# Patient Record
Sex: Male | Born: 1970 | Race: White | Hispanic: No | Marital: Married | Smoking: Current every day smoker
Health system: Southern US, Community
[De-identification: ages and names within clinical notes are randomized; demographics above are authoritative.]

## PROBLEM LIST (undated history)

## (undated) DIAGNOSIS — G629 Polyneuropathy, unspecified: Secondary | ICD-10-CM

## (undated) DIAGNOSIS — I2510 Atherosclerotic heart disease of native coronary artery without angina pectoris: Secondary | ICD-10-CM

## (undated) DIAGNOSIS — I219 Acute myocardial infarction, unspecified: Secondary | ICD-10-CM

## (undated) DIAGNOSIS — E119 Type 2 diabetes mellitus without complications: Secondary | ICD-10-CM

## (undated) DIAGNOSIS — J40 Bronchitis, not specified as acute or chronic: Secondary | ICD-10-CM

## (undated) DIAGNOSIS — J449 Chronic obstructive pulmonary disease, unspecified: Secondary | ICD-10-CM

## (undated) DIAGNOSIS — K219 Gastro-esophageal reflux disease without esophagitis: Secondary | ICD-10-CM

## (undated) DIAGNOSIS — F419 Anxiety disorder, unspecified: Secondary | ICD-10-CM

## (undated) DIAGNOSIS — Z720 Tobacco use: Secondary | ICD-10-CM

## (undated) DIAGNOSIS — D691 Qualitative platelet defects: Secondary | ICD-10-CM

## (undated) HISTORY — PX: CORONARY ANGIOPLASTY WITH STENT PLACEMENT: SHX49

## (undated) HISTORY — PX: PACEMAKER INSERTION: SHX728

## (undated) HISTORY — PX: CARDIAC DEFIBRILLATOR PLACEMENT: SHX171

---

## 2003-03-17 ENCOUNTER — Encounter: Admission: RE | Admit: 2003-03-17 | Discharge: 2003-06-15 | Payer: Self-pay | Admitting: Family Medicine

## 2014-08-20 ENCOUNTER — Encounter (HOSPITAL_BASED_OUTPATIENT_CLINIC_OR_DEPARTMENT_OTHER): Payer: Self-pay | Admitting: *Deleted

## 2014-08-20 ENCOUNTER — Emergency Department (HOSPITAL_BASED_OUTPATIENT_CLINIC_OR_DEPARTMENT_OTHER)
Admission: EM | Admit: 2014-08-20 | Discharge: 2014-08-21 | Disposition: A | Discharge status: Home / Self Care | Payer: Medicare HMO | Attending: Emergency Medicine | Admitting: Emergency Medicine

## 2014-08-20 DIAGNOSIS — R51 Headache: Secondary | ICD-10-CM | POA: Diagnosis present

## 2014-08-20 DIAGNOSIS — Z880 Allergy status to penicillin: Secondary | ICD-10-CM | POA: Diagnosis not present

## 2014-08-20 DIAGNOSIS — I2510 Atherosclerotic heart disease of native coronary artery without angina pectoris: Secondary | ICD-10-CM | POA: Insufficient documentation

## 2014-08-20 DIAGNOSIS — Z9861 Coronary angioplasty status: Secondary | ICD-10-CM | POA: Diagnosis not present

## 2014-08-20 DIAGNOSIS — Z720 Tobacco use: Secondary | ICD-10-CM | POA: Diagnosis not present

## 2014-08-20 DIAGNOSIS — Z950 Presence of cardiac pacemaker: Secondary | ICD-10-CM | POA: Insufficient documentation

## 2014-08-20 DIAGNOSIS — J449 Chronic obstructive pulmonary disease, unspecified: Secondary | ICD-10-CM | POA: Diagnosis not present

## 2014-08-20 DIAGNOSIS — I252 Old myocardial infarction: Secondary | ICD-10-CM | POA: Insufficient documentation

## 2014-08-20 DIAGNOSIS — E119 Type 2 diabetes mellitus without complications: Secondary | ICD-10-CM | POA: Diagnosis not present

## 2014-08-20 DIAGNOSIS — R519 Headache, unspecified: Secondary | ICD-10-CM

## 2014-08-20 HISTORY — DX: Chronic obstructive pulmonary disease, unspecified: J44.9

## 2014-08-20 HISTORY — DX: Bronchitis, not specified as acute or chronic: J40

## 2014-08-20 HISTORY — DX: Acute myocardial infarction, unspecified: I21.9

## 2014-08-20 HISTORY — DX: Atherosclerotic heart disease of native coronary artery without angina pectoris: I25.10

## 2014-08-20 HISTORY — DX: Type 2 diabetes mellitus without complications: E11.9

## 2014-08-20 HISTORY — DX: Polyneuropathy, unspecified: G62.9

## 2014-08-20 NOTE — ED Notes (Signed)
Pt c/o HA that began 4 days ago and is getting worse.  Pt also c/o fingers on left hand and left side of face tingling.

## 2014-08-21 ENCOUNTER — Emergency Department (HOSPITAL_BASED_OUTPATIENT_CLINIC_OR_DEPARTMENT_OTHER): Payer: Medicare HMO

## 2014-08-21 DIAGNOSIS — R51 Headache: Secondary | ICD-10-CM | POA: Diagnosis not present

## 2014-08-21 LAB — BASIC METABOLIC PANEL
ANION GAP: 11 (ref 5–15)
BUN: 17 mg/dL (ref 6–20)
CALCIUM: 9.3 mg/dL (ref 8.9–10.3)
CO2: 24 mmol/L (ref 22–32)
CREATININE: 1.1 mg/dL (ref 0.61–1.24)
Chloride: 104 mmol/L (ref 101–111)
GFR calc non Af Amer: >60 mL/min (ref >60)
GLUCOSE: 76 mg/dL (ref 65–99)
POTASSIUM: 3.3 mmol/L — AB (ref 3.5–5.1)
Sodium: 139 mmol/L (ref 135–145)

## 2014-08-21 LAB — CBC WITH DIFFERENTIAL/PLATELET
BASOS ABS: 0 10*3/uL (ref 0.0–0.1)
Basophils Relative: 0 % (ref 0–1)
EOS ABS: 0.4 10*3/uL (ref 0.0–0.7)
EOS PCT: 3 % (ref 0–5)
HCT: 50.4 % (ref 39.0–52.0)
Hemoglobin: 17.6 g/dL — ABNORMAL HIGH (ref 13.0–17.0)
LYMPHS ABS: 4.4 10*3/uL — AB (ref 0.7–4.0)
Lymphocytes Relative: 30 % (ref 12–46)
MCH: 33.7 pg (ref 26.0–34.0)
MCHC: 34.9 g/dL (ref 30.0–36.0)
MCV: 96.4 fL (ref 78.0–100.0)
MONO ABS: 1.2 10*3/uL — AB (ref 0.1–1.0)
Monocytes Relative: 8 % (ref 3–12)
Neutro Abs: 8.8 10*3/uL — ABNORMAL HIGH (ref 1.7–7.7)
Neutrophils Relative %: 59 % (ref 43–77)
Platelets: 122 10*3/uL — ABNORMAL LOW (ref 150–400)
RBC: 5.23 MIL/uL (ref 4.22–5.81)
RDW: 13.3 % (ref 11.5–15.5)
WBC: 14.8 10*3/uL — AB (ref 4.0–10.5)

## 2014-08-21 MED ORDER — METOCLOPRAMIDE HCL 5 MG/ML IJ SOLN
10.0000 mg | Freq: Once | INTRAMUSCULAR | Status: AC
  Administered 2014-08-21: 10 mg via INTRAVENOUS
  Filled 2014-08-21: qty 2

## 2014-08-21 MED ORDER — KETOROLAC TROMETHAMINE 15 MG/ML IJ SOLN
15.0000 mg | Freq: Once | INTRAMUSCULAR | Status: AC
  Administered 2014-08-21: 15 mg via INTRAVENOUS
  Filled 2014-08-21: qty 1

## 2014-08-21 MED ORDER — DIPHENHYDRAMINE HCL 50 MG/ML IJ SOLN
25.0000 mg | Freq: Once | INTRAMUSCULAR | Status: AC
  Administered 2014-08-21: 25 mg via INTRAVENOUS
  Filled 2014-08-21: qty 1

## 2014-08-21 MED ORDER — DEXAMETHASONE SODIUM PHOSPHATE 10 MG/ML IJ SOLN
10.0000 mg | Freq: Once | INTRAMUSCULAR | Status: AC
  Administered 2014-08-21: 10 mg via INTRAVENOUS
  Filled 2014-08-21: qty 1

## 2014-08-21 NOTE — Discharge Instructions (Signed)
Sinus Headache °A sinus headache is when your sinuses become clogged or swollen. Sinus headaches can range from mild to severe.  °CAUSES °A sinus headache can have different causes, such as: °· Colds. °· Sinus infections. °· Allergies. °SYMPTOMS  °Symptoms of a sinus headache may vary and can include: °· Headache. °· Pain or pressure in the face. °· Congested or runny nose. °· Fever. °· Inability to smell. °· Pain in upper teeth. °Weather changes can make symptoms worse. °TREATMENT  °The treatment of a sinus headache depends on the cause. °· Sinus pain caused by a sinus infection may be treated with antibiotic medicine. °· Sinus pain caused by allergies may be helped by allergy medicines (antihistamines) and medicated nasal sprays. °· Sinus pain caused by congestion may be helped by flushing the nose and sinuses with saline solution. °HOME CARE INSTRUCTIONS  °· If antibiotics are prescribed, take them as directed. Finish them even if you start to feel better. °· Only take over-the-counter or prescription medicines for pain, discomfort, or fever as directed by your caregiver. °· If you have congestion, use a nasal spray to help reduce pressure. °SEEK IMMEDIATE MEDICAL CARE IF: °· You have a fever. °· You have headaches more than once a week. °· You have sensitivity to light or sound. °· You have repeated nausea and vomiting. °· You have vision problems. °· You have sudden, severe pain in your face or head. °· You have a seizure. °· You are confused. °· Your sinus headaches do not get better after treatment. Many people think they have a sinus headache when they actually have migraines or tension headaches. °MAKE SURE YOU:  °· Understand these instructions. °· Will watch your condition. °· Will get help right away if you are not doing well or get worse. °Document Released: 04/21/2004 Document Revised: 06/06/2011 Document Reviewed: 06/12/2010 °[employer]® Patient Information ©2015 [employer]. This information is not  intended to replace advice given to you by your health care provider. Make sure you discuss any questions you have with your health care provider. ° °

## 2014-08-21 NOTE — ED Provider Notes (Signed)
CSN: [id number]     Arrival date & time 08/20/14  2308 History   First MD Initiated Contact with Patient 08/20/14 2352     Chief Complaint  Patient presents with  . Headache     (Consider location/radiation/quality/duration/timing/severity/associated sxs/prior Treatment) Patient is a 44 y.o. male presenting with headaches.  Headache Pain location:  Generalized Quality:  Dull Radiates to:  Does not radiate Onset quality:  Gradual Duration:  3 days Timing:  Constant Progression:  Worsening Relieved by:  Nothing Worsened by:  Activity and light Associated symptoms: no abdominal pain, no fever, no nausea, no near-syncope, no numbness, no vomiting and no weakness     Past Medical History  Diagnosis Date  . Coronary artery disease   . Myocardial infarct   . Diabetes mellitus without complication   . COPD (chronic obstructive pulmonary disease)   . Bronchitis   . Neuropathy    Past Surgical History  Procedure Laterality Date  . Cardiac defibrillator placement    . Coronary angioplasty with stent placement    . Cardiac defibrillator placement    . Pacemaker insertion     No family history on file. History  Substance Use Topics  . Smoking status: Current Every Day Smoker  . Smokeless tobacco: Not on file  . Alcohol Use: No    Review of Systems  Constitutional: Negative for fever.  Cardiovascular: Negative for near-syncope.  Gastrointestinal: Negative for nausea, vomiting and abdominal pain.  Neurological: Positive for headaches. Negative for weakness and numbness.  All other systems reviewed and are negative.     Allergies  Lipitor; Penicillins; Trichlorethylene; and Wellbutrin  Home Medications   Prior to Admission medications   Not on File   BP 115/57 mmHg  Pulse 68  Temp(Src) 98 F (36.7 C) (Oral)  Resp 18  Ht 5\' 10"  (1.778 m)  Wt 385 lb (174.635 kg)  BMI 55.24 kg/m2  SpO2 99% Physical Exam  Constitutional: He is oriented to person, place, and  time. He appears well-developed and well-nourished.  HENT:  Head: Normocephalic and atraumatic.  Eyes: Conjunctivae and EOM are normal.  Neck: Normal range of motion. Neck supple. No Brudzinski's sign and no Kernig's sign noted.  Cardiovascular: Normal rate, regular rhythm and normal heart sounds.   Pulmonary/Chest: Effort normal and breath sounds normal. No respiratory distress.  Abdominal: He exhibits no distension. There is no tenderness. There is no rebound and no guarding.  Musculoskeletal: Normal range of motion.  Neurological: He is alert and oriented to person, place, and time. He has normal strength and normal reflexes. No cranial nerve deficit or sensory deficit. Gait normal. GCS eye subscore is 4. GCS verbal subscore is 5. GCS motor subscore is 6.  Skin: Skin is warm and dry.  Vitals reviewed.   ED Course  Procedures (including critical care time) Labs Review Labs Reviewed  CBC WITH DIFFERENTIAL/PLATELET - Abnormal; Notable for the following:    WBC 14.8 (*)    Hemoglobin 17.6 (*)    Platelets 122 (*)    Neutro Abs 8.8 (*)    Lymphs Abs 4.4 (*)    Monocytes Absolute 1.2 (*)    All other components within normal limits  BASIC METABOLIC PANEL - Abnormal; Notable for the following:    Potassium 3.3 (*)    All other components within normal limits    Imaging Review Ct Head Wo Contrast  08/21/2014   CLINICAL DATA:  Headache.  Left hand and face tingling.  EXAM: CT HEAD  WITHOUT CONTRAST  TECHNIQUE: Contiguous axial images were obtained from the base of the skull through the vertex without intravenous contrast.  COMPARISON:  05/29/2014  FINDINGS: Gray-white differentiation is maintained. No CT evidence of acute large territory infarct. No intraparenchymal or extra-axial mass or hemorrhage. Normal size and configuration of the ventricles and basilar cisterns. No midline shift. Intracranial atherosclerosis. There is near complete opacification of the left maxillary sinus. There is  scattered opacification of the right maxillary sinus. The remaining paranasal sinuses and mastoid air cells are normally aerated. No air-fluid levels. Regional soft tissues appear normal. No displaced calvarial fracture.  IMPRESSION: 1. Negative noncontrast head CT. 2. Sinus disease as above.  No air-fluid levels.   Electronically Signed   By: Simonne Come M.D.   On: 08/21/2014 02:38     EKG Interpretation None      MDM   Final diagnoses:  Sinus headache    44 y.o. male with pertinent PMH of CAD, COPD, DM presents with ha in setting of recent URI.  No concerning historical elements, but pt states he has never had a ha in the past of any significance.  Exam today reassuring, no meningitic signs, and no ataxia or other signs of CVA.  HA relieved by reglan, benadryl, decadron, and toradol.  CT scan demonstrated sinus disease, and pt has frontal sinus tenderness.  This is the likely etiology of his symptoms.  DC home to fu with PCP, standard return precautions given.    I have reviewed all laboratory and imaging studies if ordered as above  1. Sinus headache         Mirian Mo, MD 08/21/14 [phone]

## 2014-08-21 NOTE — ED Notes (Signed)
Pt returned from CT °

## 2014-08-21 NOTE — ED Notes (Signed)
Patient transported to CT 

## 2016-01-03 ENCOUNTER — Encounter (HOSPITAL_COMMUNITY): Payer: Self-pay

## 2016-01-03 DIAGNOSIS — Z888 Allergy status to other drugs, medicaments and biological substances status: Secondary | ICD-10-CM | POA: Diagnosis not present

## 2016-01-03 DIAGNOSIS — I2510 Atherosclerotic heart disease of native coronary artery without angina pectoris: Secondary | ICD-10-CM | POA: Diagnosis not present

## 2016-01-03 DIAGNOSIS — I493 Ventricular premature depolarization: Secondary | ICD-10-CM | POA: Diagnosis not present

## 2016-01-03 DIAGNOSIS — E119 Type 2 diabetes mellitus without complications: Secondary | ICD-10-CM | POA: Diagnosis present

## 2016-01-03 DIAGNOSIS — Z955 Presence of coronary angioplasty implant and graft: Secondary | ICD-10-CM | POA: Diagnosis not present

## 2016-01-03 DIAGNOSIS — Z79899 Other long term (current) drug therapy: Secondary | ICD-10-CM | POA: Insufficient documentation

## 2016-01-03 DIAGNOSIS — I110 Hypertensive heart disease with heart failure: Secondary | ICD-10-CM | POA: Diagnosis not present

## 2016-01-03 DIAGNOSIS — Z794 Long term (current) use of insulin: Secondary | ICD-10-CM | POA: Insufficient documentation

## 2016-01-03 DIAGNOSIS — I219 Acute myocardial infarction, unspecified: Secondary | ICD-10-CM | POA: Insufficient documentation

## 2016-01-03 DIAGNOSIS — Z880 Allergy status to penicillin: Secondary | ICD-10-CM | POA: Diagnosis not present

## 2016-01-03 DIAGNOSIS — D691 Qualitative platelet defects: Secondary | ICD-10-CM | POA: Diagnosis present

## 2016-01-03 DIAGNOSIS — F17210 Nicotine dependence, cigarettes, uncomplicated: Secondary | ICD-10-CM | POA: Insufficient documentation

## 2016-01-03 DIAGNOSIS — Z950 Presence of cardiac pacemaker: Secondary | ICD-10-CM | POA: Insufficient documentation

## 2016-01-03 DIAGNOSIS — I252 Old myocardial infarction: Secondary | ICD-10-CM | POA: Insufficient documentation

## 2016-01-03 DIAGNOSIS — Z95810 Presence of automatic (implantable) cardiac defibrillator: Secondary | ICD-10-CM | POA: Insufficient documentation

## 2016-01-03 DIAGNOSIS — D696 Thrombocytopenia, unspecified: Secondary | ICD-10-CM | POA: Diagnosis not present

## 2016-01-03 DIAGNOSIS — E785 Hyperlipidemia, unspecified: Secondary | ICD-10-CM | POA: Insufficient documentation

## 2016-01-03 DIAGNOSIS — J8410 Pulmonary fibrosis, unspecified: Secondary | ICD-10-CM | POA: Insufficient documentation

## 2016-01-03 DIAGNOSIS — R079 Chest pain, unspecified: Principal | ICD-10-CM | POA: Insufficient documentation

## 2016-01-03 DIAGNOSIS — Z6841 Body Mass Index (BMI) 40.0 and over, adult: Secondary | ICD-10-CM | POA: Insufficient documentation

## 2016-01-03 DIAGNOSIS — J449 Chronic obstructive pulmonary disease, unspecified: Secondary | ICD-10-CM | POA: Diagnosis not present

## 2016-01-03 DIAGNOSIS — I517 Cardiomegaly: Secondary | ICD-10-CM | POA: Insufficient documentation

## 2016-01-03 DIAGNOSIS — E1140 Type 2 diabetes mellitus with diabetic neuropathy, unspecified: Secondary | ICD-10-CM | POA: Diagnosis not present

## 2016-01-03 DIAGNOSIS — F419 Anxiety disorder, unspecified: Secondary | ICD-10-CM | POA: Diagnosis present

## 2016-01-03 DIAGNOSIS — I5020 Unspecified systolic (congestive) heart failure: Secondary | ICD-10-CM | POA: Insufficient documentation

## 2016-01-03 DIAGNOSIS — K219 Gastro-esophageal reflux disease without esophagitis: Secondary | ICD-10-CM | POA: Insufficient documentation

## 2016-01-03 DIAGNOSIS — Z7982 Long term (current) use of aspirin: Secondary | ICD-10-CM | POA: Insufficient documentation

## 2016-01-03 NOTE — ED Triage Notes (Signed)
Pt states that central CP started night, radiation to L arm. C/o SOB, denies n/v. Pt states about a month ago had cath with a blockage found and they were unable to remove the blockage. PTA pt took three nitros.

## 2016-01-04 ENCOUNTER — Observation Stay (HOSPITAL_COMMUNITY)
Admission: EM | Admit: 2016-01-04 | Discharge: 2016-01-04 | Disposition: A | Discharge status: Home / Self Care | Payer: Medicare Other | Attending: Internal Medicine | Admitting: Internal Medicine

## 2016-01-04 ENCOUNTER — Emergency Department (HOSPITAL_COMMUNITY): Payer: Medicare Other

## 2016-01-04 ENCOUNTER — Encounter (HOSPITAL_COMMUNITY): Payer: Self-pay | Admitting: Internal Medicine

## 2016-01-04 ENCOUNTER — Observation Stay (HOSPITAL_BASED_OUTPATIENT_CLINIC_OR_DEPARTMENT_OTHER): Payer: Medicare Other

## 2016-01-04 DIAGNOSIS — E785 Hyperlipidemia, unspecified: Secondary | ICD-10-CM

## 2016-01-04 DIAGNOSIS — I5022 Chronic systolic (congestive) heart failure: Secondary | ICD-10-CM | POA: Diagnosis present

## 2016-01-04 DIAGNOSIS — I208 Other forms of angina pectoris: Secondary | ICD-10-CM

## 2016-01-04 DIAGNOSIS — M79669 Pain in unspecified lower leg: Secondary | ICD-10-CM | POA: Diagnosis present

## 2016-01-04 DIAGNOSIS — I2510 Atherosclerotic heart disease of native coronary artery without angina pectoris: Secondary | ICD-10-CM | POA: Diagnosis present

## 2016-01-04 DIAGNOSIS — R079 Chest pain, unspecified: Secondary | ICD-10-CM | POA: Diagnosis present

## 2016-01-04 DIAGNOSIS — J449 Chronic obstructive pulmonary disease, unspecified: Secondary | ICD-10-CM | POA: Diagnosis present

## 2016-01-04 DIAGNOSIS — M79661 Pain in right lower leg: Secondary | ICD-10-CM

## 2016-01-04 DIAGNOSIS — Z720 Tobacco use: Secondary | ICD-10-CM | POA: Diagnosis present

## 2016-01-04 DIAGNOSIS — K219 Gastro-esophageal reflux disease without esophagitis: Secondary | ICD-10-CM | POA: Diagnosis present

## 2016-01-04 DIAGNOSIS — E119 Type 2 diabetes mellitus without complications: Secondary | ICD-10-CM | POA: Diagnosis not present

## 2016-01-04 DIAGNOSIS — M79662 Pain in left lower leg: Secondary | ICD-10-CM

## 2016-01-04 DIAGNOSIS — F419 Anxiety disorder, unspecified: Secondary | ICD-10-CM | POA: Diagnosis not present

## 2016-01-04 DIAGNOSIS — I2089 Other forms of angina pectoris: Secondary | ICD-10-CM

## 2016-01-04 DIAGNOSIS — D691 Qualitative platelet defects: Secondary | ICD-10-CM

## 2016-01-04 HISTORY — DX: Tobacco use: Z72.0

## 2016-01-04 HISTORY — DX: Qualitative platelet defects: D69.1

## 2016-01-04 HISTORY — DX: Anxiety disorder, unspecified: F41.9

## 2016-01-04 HISTORY — DX: Gastro-esophageal reflux disease without esophagitis: K21.9

## 2016-01-04 LAB — BASIC METABOLIC PANEL
Anion gap: 8 (ref 5–15)
BUN: 13 mg/dL (ref 6–20)
CALCIUM: 9.2 mg/dL (ref 8.9–10.3)
CO2: 25 mmol/L (ref 22–32)
CREATININE: 0.96 mg/dL (ref 0.61–1.24)
Chloride: 102 mmol/L (ref 101–111)
GFR calc Af Amer: >60 mL/min (ref >60)
Glucose, Bld: 357 mg/dL — ABNORMAL HIGH (ref 65–99)
Potassium: 4.3 mmol/L (ref 3.5–5.1)
Sodium: 135 mmol/L (ref 135–145)

## 2016-01-04 LAB — LIPID PANEL
Cholesterol: 151 mg/dL (ref 0–200)
HDL: 28 mg/dL — AB (ref >40)
LDL CALC: 63 mg/dL (ref 0–99)
TRIGLYCERIDES: 299 mg/dL — AB (ref <150)
Total CHOL/HDL Ratio: 5.4 RATIO
VLDL: 60 mg/dL — ABNORMAL HIGH (ref 0–40)

## 2016-01-04 LAB — CBC
HCT: 44.4 % (ref 39.0–52.0)
HCT: 43.1 % (ref 39.0–52.0)
HEMOGLOBIN: 14.7 g/dL (ref 13.0–17.0)
Hemoglobin: 14.3 g/dL (ref 13.0–17.0)
MCH: 33.6 pg (ref 26.0–34.0)
MCH: 33.7 pg (ref 26.0–34.0)
MCHC: 33.1 g/dL (ref 30.0–36.0)
MCHC: 33.2 g/dL (ref 30.0–36.0)
MCV: 101.6 fL — ABNORMAL HIGH (ref 78.0–100.0)
MCV: 101.7 fL — AB (ref 78.0–100.0)
PLATELETS: 60 10*3/uL — AB (ref 150–400)
Platelets: 74 10*3/uL — ABNORMAL LOW (ref 150–400)
RBC: 4.37 MIL/uL (ref 4.22–5.81)
RBC: 4.24 MIL/uL (ref 4.22–5.81)
RDW: 13.4 % (ref 11.5–15.5)
RDW: 13.6 % (ref 11.5–15.5)
WBC: 7.2 10*3/uL (ref 4.0–10.5)
WBC: 6.4 10*3/uL (ref 4.0–10.5)

## 2016-01-04 LAB — GLUCOSE, CAPILLARY
Glucose-Capillary: 272 mg/dL — ABNORMAL HIGH (ref 65–99)
Glucose-Capillary: 289 mg/dL — ABNORMAL HIGH (ref 65–99)

## 2016-01-04 LAB — RAPID URINE DRUG SCREEN, HOSP PERFORMED
AMPHETAMINES: NOT DETECTED
BARBITURATES: NOT DETECTED
Benzodiazepines: POSITIVE — AB
COCAINE: NOT DETECTED
OPIATES: POSITIVE — AB
TETRAHYDROCANNABINOL: NOT DETECTED

## 2016-01-04 LAB — TROPONIN I
TROPONIN I: 0.03 ng/mL — AB (ref <0.03)
Troponin I: <0.03 ng/mL (ref <0.03)
Troponin I: <0.03 ng/mL (ref <0.03)

## 2016-01-04 LAB — PROTIME-INR
INR: 1
PROTHROMBIN TIME: 13.2 s (ref 11.4–15.2)

## 2016-01-04 LAB — BRAIN NATRIURETIC PEPTIDE: B Natriuretic Peptide: 75 pg/mL (ref 0.0–100.0)

## 2016-01-04 LAB — I-STAT TROPONIN, ED: TROPONIN I, POC: 0 ng/mL (ref 0.00–0.08)

## 2016-01-04 LAB — MRSA PCR SCREENING: MRSA by PCR: NEGATIVE

## 2016-01-04 LAB — HEPARIN LEVEL (UNFRACTIONATED): Heparin Unfractionated: 0.2 IU/mL — ABNORMAL LOW (ref 0.30–0.70)

## 2016-01-04 MED ORDER — AMLODIPINE BESYLATE 2.5 MG PO TABS
2.5000 mg | ORAL_TABLET | Freq: Every day | ORAL | Status: DC
  Administered 2016-01-04: 2.5 mg via ORAL
  Filled 2016-01-04: qty 1

## 2016-01-04 MED ORDER — NICOTINE 21 MG/24HR TD PT24
21.0000 mg | MEDICATED_PATCH | Freq: Every day | TRANSDERMAL | Status: DC
  Administered 2016-01-04: 21 mg via TRANSDERMAL
  Filled 2016-01-04: qty 1

## 2016-01-04 MED ORDER — RAMIPRIL 5 MG PO CAPS
5.0000 mg | ORAL_CAPSULE | Freq: Every day | ORAL | Status: DC
  Filled 2016-01-04: qty 1

## 2016-01-04 MED ORDER — HEPARIN BOLUS VIA INFUSION
4000.0000 [IU] | Freq: Once | INTRAVENOUS | Status: AC
  Administered 2016-01-04: 4000 [IU] via INTRAVENOUS
  Filled 2016-01-04: qty 4000

## 2016-01-04 MED ORDER — ALBUTEROL SULFATE (2.5 MG/3ML) 0.083% IN NEBU: 2.5000 mg | INHALATION_SOLUTION | Freq: Four times a day (QID) | RESPIRATORY_TRACT | Status: DC | PRN

## 2016-01-04 MED ORDER — METOPROLOL TARTRATE 50 MG PO TABS
100.0000 mg | ORAL_TABLET | Freq: Two times a day (BID) | ORAL | Status: DC
  Administered 2016-01-04: 100 mg via ORAL
  Filled 2016-01-04: qty 2

## 2016-01-04 MED ORDER — LORATADINE 10 MG PO TABS
10.0000 mg | ORAL_TABLET | Freq: Every day | ORAL | Status: DC
  Administered 2016-01-04: 10 mg via ORAL
  Filled 2016-01-04: qty 1

## 2016-01-04 MED ORDER — INSULIN ASPART 100 UNIT/ML ~~LOC~~ SOLN
0.0000 [IU] | SUBCUTANEOUS | Status: DC
  Administered 2016-01-04: 11 [IU] via SUBCUTANEOUS

## 2016-01-04 MED ORDER — ACETAMINOPHEN 500 MG PO TABS
1000.0000 mg | ORAL_TABLET | Freq: Once | ORAL | Status: AC
  Administered 2016-01-04: 1000 mg via ORAL
  Filled 2016-01-04: qty 2

## 2016-01-04 MED ORDER — LINACLOTIDE 145 MCG PO CAPS
290.0000 ug | ORAL_CAPSULE | Freq: Every day | ORAL | Status: DC
  Filled 2016-01-04: qty 1

## 2016-01-04 MED ORDER — ISOSORBIDE MONONITRATE ER 30 MG PO TB24: 30.0000 mg | ORAL_TABLET | Freq: Every day | ORAL | Status: DC

## 2016-01-04 MED ORDER — ONDANSETRON HCL 4 MG/2ML IJ SOLN: 4.0000 mg | Freq: Four times a day (QID) | INTRAMUSCULAR | Status: DC | PRN

## 2016-01-04 MED ORDER — ASPIRIN 81 MG PO CHEW
81.0000 mg | CHEWABLE_TABLET | Freq: Every day | ORAL | Status: DC
  Administered 2016-01-04: 81 mg via ORAL
  Filled 2016-01-04: qty 1

## 2016-01-04 MED ORDER — HEPARIN (PORCINE) IN NACL 100-0.45 UNIT/ML-% IJ SOLN
1700.0000 [IU]/h | INTRAMUSCULAR | Status: DC
  Administered 2016-01-04: 1500 [IU]/h via INTRAVENOUS
  Filled 2016-01-04 (×2): qty 250

## 2016-01-04 MED ORDER — ACETAMINOPHEN 325 MG PO TABS: 650.0000 mg | ORAL_TABLET | ORAL | Status: DC | PRN

## 2016-01-04 MED ORDER — ALPRAZOLAM 0.5 MG PO TABS: 0.5000 mg | ORAL_TABLET | Freq: Three times a day (TID) | ORAL | Status: DC | PRN

## 2016-01-04 MED ORDER — CYCLOBENZAPRINE HCL 10 MG PO TABS: 10.0000 mg | ORAL_TABLET | Freq: Three times a day (TID) | ORAL | Status: DC | PRN

## 2016-01-04 MED ORDER — GABAPENTIN 400 MG PO CAPS: 1600.0000 mg | ORAL_CAPSULE | Freq: Every day | ORAL | Status: DC

## 2016-01-04 MED ORDER — INSULIN ASPART 100 UNIT/ML ~~LOC~~ SOLN: 0.0000 [IU] | Freq: Three times a day (TID) | SUBCUTANEOUS | Status: DC

## 2016-01-04 MED ORDER — NICOTINE 21 MG/24HR TD PT24: 21.0000 mg | MEDICATED_PATCH | Freq: Every day | TRANSDERMAL | 0 refills | Status: AC

## 2016-01-04 MED ORDER — CLOPIDOGREL BISULFATE 75 MG PO TABS
75.0000 mg | ORAL_TABLET | Freq: Every day | ORAL | Status: DC
  Administered 2016-01-04: 75 mg via ORAL
  Filled 2016-01-04: qty 1

## 2016-01-04 MED ORDER — ISOSORBIDE MONONITRATE ER 30 MG PO TB24: 30.0000 mg | ORAL_TABLET | Freq: Every day | ORAL | 0 refills | Status: AC

## 2016-01-04 MED ORDER — TORSEMIDE 20 MG PO TABS
20.0000 mg | ORAL_TABLET | Freq: Every day | ORAL | Status: DC
  Administered 2016-01-04: 20 mg via ORAL
  Filled 2016-01-04: qty 1

## 2016-01-04 MED ORDER — ASPIRIN 81 MG PO CHEW
324.0000 mg | CHEWABLE_TABLET | Freq: Once | ORAL | Status: AC
  Administered 2016-01-04: 324 mg via ORAL
  Filled 2016-01-04: qty 4

## 2016-01-04 MED ORDER — HYDROCODONE-ACETAMINOPHEN 5-325 MG PO TABS: 1.0000 | ORAL_TABLET | ORAL | Status: DC | PRN

## 2016-01-04 MED ORDER — GABAPENTIN 400 MG PO CAPS
800.0000 mg | ORAL_CAPSULE | Freq: Three times a day (TID) | ORAL | Status: DC
  Administered 2016-01-04 (×2): 800 mg via ORAL
  Filled 2016-01-04 (×2): qty 2

## 2016-01-04 MED ORDER — NITROGLYCERIN 2 % TD OINT
1.0000 [in_us] | TOPICAL_OINTMENT | Freq: Once | TRANSDERMAL | Status: AC
  Administered 2016-01-04: 1 [in_us] via TOPICAL
  Filled 2016-01-04: qty 1

## 2016-01-04 MED ORDER — GABAPENTIN 800 MG PO TABS
800.0000 mg | ORAL_TABLET | ORAL | Status: DC
Start: 2016-01-04 — End: 2016-01-04

## 2016-01-04 MED ORDER — HYDROMORPHONE HCL 1 MG/ML IJ SOLN
1.0000 mg | INTRAMUSCULAR | Status: DC | PRN
  Administered 2016-01-04: 1 mg via INTRAVENOUS
  Filled 2016-01-04: qty 1

## 2016-01-04 MED ORDER — MORPHINE SULFATE (PF) 2 MG/ML IV SOLN
2.0000 mg | INTRAVENOUS | Status: DC | PRN
  Administered 2016-01-04: 2 mg via INTRAVENOUS
  Filled 2016-01-04: qty 1

## 2016-01-04 MED ORDER — PANTOPRAZOLE SODIUM 40 MG PO TBEC
40.0000 mg | DELAYED_RELEASE_TABLET | Freq: Two times a day (BID) | ORAL | Status: DC
  Administered 2016-01-04: 40 mg via ORAL
  Filled 2016-01-04: qty 1

## 2016-01-04 MED ORDER — HYDROCODONE-ACETAMINOPHEN 5-325 MG PO TABS: 1.0000 | ORAL_TABLET | ORAL | 0 refills | Status: AC | PRN

## 2016-01-04 MED ORDER — SPIRONOLACTONE 25 MG PO TABS
50.0000 mg | ORAL_TABLET | Freq: Every day | ORAL | Status: DC
  Administered 2016-01-04: 50 mg via ORAL
  Filled 2016-01-04: qty 2

## 2016-01-04 MED ORDER — ROSUVASTATIN CALCIUM 10 MG PO TABS: 10.0000 mg | ORAL_TABLET | Freq: Every day | ORAL | Status: DC

## 2016-01-04 MED ORDER — INSULIN GLARGINE 100 UNIT/ML ~~LOC~~ SOLN
5.0000 [IU] | Freq: Every day | SUBCUTANEOUS | Status: DC
  Filled 2016-01-04: qty 0.05

## 2016-01-04 MED ORDER — ZOLPIDEM TARTRATE 5 MG PO TABS: 5.0000 mg | ORAL_TABLET | Freq: Every evening | ORAL | Status: DC | PRN

## 2016-01-04 MED ORDER — NITROGLYCERIN IN D5W 200-5 MCG/ML-% IV SOLN
2.0000 ug/min | INTRAVENOUS | Status: DC
Start: 2016-01-04 — End: 2016-01-04
  Administered 2016-01-04: 5 ug/min via INTRAVENOUS
  Filled 2016-01-04: qty 250

## 2016-01-04 NOTE — ED Provider Notes (Signed)
[facility]-EMERGENCY DEPT Provider Note   CSN: [id number] Arrival date & time: 01/03/16  2334  By signing my name below, I, Arianna Nassar, attest that this documentation has been prepared under the direction and in the presence of Nira Conn, MD.  Electronically Signed: Octavia Heir, ED Scribe. 01/04/16. 1:11 AM.    History   Chief Complaint Chief Complaint  Patient presents with  . Chest Pain    The history is provided by the patient.   HPI Comments: Alex Kim is a 45 y.o. male who has a PMhx of 5 MI and 16 stents placed, COPD, CAD, DM, presents to the Emergency Department complaining of sudden onset, waxing and waning, gradual worsening, moderate, central chest pain that radiates into his left arm that started earlier this evening. Pt has a defibrillator placed. He reports associated nausea, leg swelling x 2 days, dry heaves, and diaphoresis. Pt reports any movement increases his pain. He notes having his RCA 100% blocked ~ 1 month ago. Pt says his current chest pain feels similar to his prior MI in the past. Pt has been taking his nitroglycerin to alleviate his chest pain with temporary relief. He further states having a strong family hx of heart problems. He denies vomiting, fever, rhinorrhea, nasal congestion, recent pneumonia, or hx of DVT.  Past Medical History:  Diagnosis Date  . Anxiety   . Bronchitis   . COPD (chronic obstructive pulmonary disease) (HCC)   . Coronary artery disease   . Diabetes mellitus without complication (HCC)   . GERD (gastroesophageal reflux disease)   . Myocardial infarct   . Neuropathy (HCC)   . Tobacco abuse     Patient Active Problem List   Diagnosis Date Noted  . HLD (hyperlipidemia) 01/04/2016  . Chest pain 01/04/2016  . Diabetes mellitus without complication (HCC)   . Coronary artery disease   . COPD (chronic obstructive pulmonary disease) (HCC)   . GERD (gastroesophageal reflux disease)   . Anxiety     Past Surgical  History:  Procedure Laterality Date  . CARDIAC DEFIBRILLATOR PLACEMENT    . CARDIAC DEFIBRILLATOR PLACEMENT    . CORONARY ANGIOPLASTY WITH STENT PLACEMENT    . PACEMAKER INSERTION         Home Medications    Prior to Admission medications   Medication Sig Start Date End Date Taking? Authorizing Provider  albuterol (PROVENTIL) (2.5 MG/3ML) 0.083% nebulizer solution Take 2.5 mg by nebulization every 6 (six) hours as needed for wheezing or shortness of breath.   Yes Historical Provider, MD  ALPRAZolam Prudy Feeler) 0.5 MG tablet Take 0.5 mg by mouth 3 (three) times daily as needed for anxiety.   Yes Historical Provider, MD  amLODipine (NORVASC) 2.5 MG tablet Take 2.5 mg by mouth daily.   Yes Historical Provider, MD  aspirin 81 MG chewable tablet Chew 81 mg by mouth daily.   Yes Historical Provider, MD  cetirizine (ZYRTEC) 10 MG tablet Take 10 mg by mouth daily.   Yes Historical Provider, MD  clopidogrel (PLAVIX) 75 MG tablet Take 75 mg by mouth daily.   Yes Historical Provider, MD  cyclobenzaprine (FLEXERIL) 10 MG tablet Take 10 mg by mouth 3 (three) times daily as needed for muscle spasms.   Yes Historical Provider, MD  gabapentin (NEURONTIN) 800 MG tablet Take 800-1,600 mg by mouth See admin instructions. Take 1 tablet three times a day and take 2 tablets at bedtime   Yes Historical Provider, MD  HYDROcodone-acetaminophen (NORCO/VICODIN) 5-325 MG  tablet Take 1 tablet by mouth every 4 (four) hours as needed for moderate pain.   Yes Historical Provider, MD  insulin regular human CONCENTRATED (HUMULIN R) 500 UNIT/ML injection Inject 40 Units into the skin 2 (two) times daily with a meal.   Yes Historical Provider, MD  linaclotide (LINZESS) 290 MCG CAPS capsule Take 290 mcg by mouth daily before breakfast.   Yes Historical Provider, MD  metoprolol (LOPRESSOR) 100 MG tablet Take 100 mg by mouth 2 (two) times daily.   Yes Historical Provider, MD  nitroGLYCERIN (NITROSTAT) 0.4 MG SL tablet Place 0.4 mg  under the tongue every 5 (five) minutes as needed for chest pain.   Yes Historical Provider, MD  pantoprazole (PROTONIX) 40 MG tablet Take 40 mg by mouth 2 (two) times daily.   Yes Historical Provider, MD  potassium chloride (K-DUR,KLOR-CON) 10 MEQ tablet Take 10 mEq by mouth daily.   Yes Historical Provider, MD  Pramlintide Acetate 2700 MCG/2.7ML SOPN Inject 120 mcg into the skin 3 (three) times daily.   Yes Historical Provider, MD  promethazine (PHENERGAN) 25 MG tablet Take 25 mg by mouth every 6 (six) hours as needed for nausea or vomiting.   Yes Historical Provider, MD  ramipril (ALTACE) 5 MG capsule Take 5 mg by mouth daily.   Yes Historical Provider, MD  rosuvastatin (CRESTOR) 10 MG tablet Take 10 mg by mouth daily.   Yes Historical Provider, MD  spironolactone (ALDACTONE) 50 MG tablet Take 50 mg by mouth daily.   Yes Historical Provider, MD  torsemide (DEMADEX) 20 MG tablet Take 20 mg by mouth daily.   Yes Historical Provider, MD    Family History No family history on file.  Social History Social History  Substance Use Topics  . Smoking status: Current Every Day Smoker  . Smokeless tobacco: Never Used  . Alcohol use No     Allergies   Lipitor [atorvastatin]; Penicillins; Trichlorethylene; and Wellbutrin [bupropion]   Review of Systems Review of Systems  A complete 10 system review of systems was obtained and all systems are negative except as noted in the HPI and PMH.    Physical Exam Updated Vital Signs BP (!) 115/53 (BP Location: Left Arm)   Pulse 78   Temp 98.1 F (36.7 C) (Oral)   Resp 18   Ht 5\' 10"  (1.778 m)   Wt (!) 313 lb (142 kg)   SpO2 100%   BMI 44.91 kg/m   Physical Exam  Constitutional: He is oriented to person, place, and time. He appears well-developed and well-nourished. No distress.  obese  HENT:  Head: Normocephalic and atraumatic.  Nose: Nose normal.  Eyes: Conjunctivae and EOM are normal. Pupils are equal, round, and reactive to light.  Right eye exhibits no discharge. Left eye exhibits no discharge. No scleral icterus.  Neck: Normal range of motion. Neck supple.  Cardiovascular: Normal rate and regular rhythm.  Exam reveals no gallop and no friction rub.   No murmur heard. Pulmonary/Chest: Effort normal and breath sounds normal. No stridor. No respiratory distress. He has no rales.  Abdominal: Soft. He exhibits no distension. There is no tenderness.  Musculoskeletal: He exhibits edema. He exhibits no tenderness.  1+ bilateral pitting edema  Neurological: He is alert and oriented to person, place, and time.  Skin: Skin is warm and dry. No rash noted. He is not diaphoretic. No erythema.  Psychiatric: He has a normal mood and affect.  Vitals reviewed.    ED Treatments / Results  DIAGNOSTIC STUDIES: Oxygen Saturation is 96% on RA, normal by my interpretation.  COORDINATION OF CARE:  1:09 AM Discussed treatment plan with pt at bedside and pt agreed to plan.  Labs (all labs ordered are listed, but only abnormal results are displayed) Labs Reviewed  BASIC METABOLIC PANEL - Abnormal; Notable for the following:       Result Value   Glucose, Bld 357 (*)    All other components within normal limits  CBC - Abnormal; Notable for the following:    MCV 101.6 (*)    Platelets 74 (*)    All other components within normal limits  I-STAT TROPOININ, ED    EKG  EKG Interpretation  Date/Time:  Sunday January 03 2016 23:40:40 EDT Ventricular Rate:  80 PR Interval:  186 QRS Duration: 102 QT Interval:  368 QTC Calculation: 424 R Axis:   39 Text Interpretation:  Normal sinus rhythm Low voltage QRS Inferior infarct , age undetermined Anterolateral infarct , age undetermined Abnormal ECG No old tracing to compare Confirmed by [facility] MD, Cordaro Mukai [phone]) on 01/04/2016 12:47:48 AM       Radiology Dg Chest 2 View  Result Date: 01/04/2016 CLINICAL DATA:  Left-sided chest pain. EXAM: CHEST  2 VIEW COMPARISON:  12/14/2015  FINDINGS: Left-sided pacemaker remains in place. Heart size and mediastinal contours are unchanged. No pulmonary edema, focal airspace disease, pleural effusion or pneumothorax. Calcified granuloma in the left lung. IMPRESSION: No active disease. Electronically Signed   By: Rubye Oaks M.D.   On: 01/04/2016 01:16    Procedures Procedures (including critical care time)  Medications Ordered in ED Medications  acetaminophen (TYLENOL) tablet 1,000 mg (not administered)  nitroGLYCERIN (NITROGLYN) 2 % ointment 1 inch (1 inch Topical Given 01/04/16 0132)  aspirin chewable tablet 324 mg (324 mg Oral Given 01/04/16 0132)     Initial Impression / Assessment and Plan / ED Course  I have reviewed the triage vital signs and the nursing notes.  Pertinent labs & imaging results that were available during my care of the patient were reviewed by me and considered in my medical decision making (see chart for details).  Clinical Course    EKG w/o acute ischemia or pericarditis. Initial trop negative. Chest x-ray without evidence suggestive of pneumonia, pneumothorax, pneumomediastinum.  No abnormal contour of the mediastinum to suggest dissection. No evidence of acute injuries. Consistent with unstable angina.   Presentation consistent with aortic dissection or esophageal perforation. Low suspicion for pulmonary embolism.  Discussed case with cardiology who recommended hospitalist admission for ACS rule out. Appreciate hospitalist admission.   I personally performed the services described in this documentation, which was scribed in my presence. The recorded information has been reviewed and is accurate.    Final Clinical Impressions(s) / ED Diagnoses   Final diagnoses:  Diabetes mellitus without complication (HCC)  Gastroesophageal reflux disease without esophagitis  Anxiety    Disposition: Admit  Condition: Stable    Nira Conn, MD 01/04/16 0157

## 2016-01-04 NOTE — Progress Notes (Signed)
VASCULAR LAB PRELIMINARY  PRELIMINARY  PRELIMINARY  PRELIMINARY  Bilateral lower extremity venous duplex completed.    Preliminary report:  There is no DVT or SVT noted in the bilateral lower extremities.   Leeasia Secrist, RVT 01/04/2016, 12:57 PM

## 2016-01-04 NOTE — Progress Notes (Signed)
Patient admitted after midnight- please see H&P.  Long history of CAD and non-compliance with medical regimen.  Still smoking.  Poorly controlled DM.  Recent cath at [facility].  Then went to [facility] where he left AMA.  Presents here "after doing research for [facility]" for continued chest pain.  Cardiology consult.  Marlin Canary DO

## 2016-01-04 NOTE — Discharge Summary (Signed)
Physician Discharge Summary  Alex Kim AJG:[id number] DOB: [birth year] DOA: 01/04/2016  PCP: Cheral Bay, MD  Admit date: 01/04/2016 Discharge date: 01/04/2016   Recommendations for Outpatient Follow-Up:   1. Outpatient follow up for cath intervention 2. Stricter diabetic control 3. Stop smoking   Discharge Diagnosis:   Principal Problem:   Chest pain Active Problems:   Diabetes mellitus without complication (HCC)   Coronary artery disease   COPD (chronic obstructive pulmonary disease) (HCC)   HLD (hyperlipidemia)   GERD (gastroesophageal reflux disease)   Anxiety   Calf pain   Thrombocytopathia (HCC)   Chronic systolic CHF (congestive heart failure) (HCC)   Tobacco abuse   Angina at rest [facility])   Discharge disposition:  Home:  Discharge Condition: Improved.  Diet recommendation: Low sodium, heart healthy.  Carbohydrate-modified.  Regular.  Wound care: None.   History of Present Illness:   Alex Kim is a 45 y.o. 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  Patient states that his chest pain started at 8 PM on Saturday. It is located in the front chest, intermittent, pressure-like, 8 out of 10 in severity, radiating to the left arm and left shoulder. It is not aggravated or alleviated by any known factors. It is associated with shortness of breath. Patient states that he has chronic bilateral calf pain which has been going on for 3 years, no changes recently. He states that he drove back from [place] yesterday (3 hours). He has mild dry cough due to smoking. No fever or chills. He has nausea, but no vomiting, abdominal pain, diarrhea, symptoms of UTI. No unilateral weakness. Of note, patient went for elective heart cath on 11/25/17 at [facility], at which time he was found to have chronic total occlusion of RCA with collateral filling. PCI at that time was unsuccessful due to failure to cross wire. His cardiologist is Dr. Judithe Modest in [facility].   Hospital Course by Problem:   45 yo male with complex cardiac history and ongoing anginal symptoms. He is actively followed by Dr. Judithe Modest in [facility] and has been recently seen and cathed - found to have RCA CTO with collaterals. Recent myoview at [facility] demonstrated lateral wall ischemia, suggesting collaterals are insufficient. He is being considered for CTO intervention in [facility]. He has been intolerant to nitrates in the past due to migraines and could not take Ranexa. He was put on low dose CCB without improvement. He still smokes and has poorly controlled dyslipidemia due to mediation intolerance. Troponins here have been negative. Pain is now back to baseline. I agree with an attempt at CTO intervention, however, this is best done as a scheduled procedure with the appropriate equipment as an outpatient. He wishes to follow up with Dr. Beverely Pace and Dr. Judithe Modest to accomplish this. Will d/c heparin and nitroglycerin. Start imdur 30 mg daily for chest pain - he could take it up to twice daily if pain is not well controlled. Counseled to stop smoking cigarettes. Need to consider alternatives for cholesterol such as Zetia, Welchol, etc, if he is unable to take statins due to elevated liver enzymes.    Medical Consultants:    cards   Discharge Exam:   Vitals:   01/04/16 1028 01/04/16 1100  BP: 105/66 (!) 99/56  Pulse: 80 71  Resp: 15 14  Temp:  97.8 F (36.6 C)   Vitals:   01/04/16 0800 01/04/16 0855 01/04/16 1028 01/04/16 1100  BP: 112/57 125/81 105/66 (!) 99/56  Pulse: 85 84 80 71  Resp: 15 16 15 14   Temp:    97.8 F (36.6 C)  TempSrc:    Oral  SpO2: 95% 96% 96% 97%  Weight:      Height:        Gen:  NAD    The results of significant diagnostics from this  hospitalization (including imaging, microbiology, ancillary and laboratory) are listed below for reference.     Procedures and Diagnostic Studies:   Dg Chest 2 View  Result Date: 01/04/2016 CLINICAL DATA:  Left-sided chest pain. EXAM: CHEST  2 VIEW COMPARISON:  12/14/2015 FINDINGS: Left-sided pacemaker remains in place. Heart size and mediastinal contours are unchanged. No pulmonary edema, focal airspace disease, pleural effusion or pneumothorax. Calcified granuloma in the left lung. IMPRESSION: No active disease. Electronically Signed   By: Rubye Oaks M.D.   On: 01/04/2016 01:16     Labs:   Basic Metabolic Panel:  Recent Labs Lab 01/03/16 2350  NA 135  K 4.3  CL 102  CO2 25  GLUCOSE 357*  BUN 13  CREATININE 0.96  CALCIUM 9.2   GFR Estimated Creatinine Clearance: 136.8 mL/min (by C-G formula based on SCr of 0.96 mg/dL). Liver Function Tests: No results for input(s): AST, ALT, ALKPHOS, BILITOT, PROT, ALBUMIN in the last 168 hours. No results for input(s): LIPASE, AMYLASE in the last 168 hours. No results for input(s): AMMONIA in the last 168 hours. Coagulation profile  Recent Labs Lab 01/03/16 2324  INR 1.00    CBC:  Recent Labs Lab 01/03/16 2350 01/04/16 0855  WBC 7.2 6.4  HGB 14.7 14.3  HCT 44.4 43.1  MCV 101.6* 101.7*  PLT 74* 60*   Cardiac Enzymes:  Recent Labs Lab 01/04/16 0236 01/04/16 0855  TROPONINI <0.03 0.03*   BNP: Invalid input(s): POCBNP CBG:  Recent Labs Lab 01/04/16 1026 01/04/16 1224  GLUCAP 272* 289*   D-Dimer No results for input(s): DDIMER in the last 72 hours. Hgb A1c No results for input(s): HGBA1C in the last 72 hours. Lipid Profile  Recent Labs  01/04/16 0807  CHOL 151  HDL 28*  LDLCALC 63  TRIG 161*  CHOLHDL 5.4   Thyroid function studies No results for input(s): TSH, T4TOTAL, T3FREE, THYROIDAB in the last 72 hours.  Invalid input(s): FREET3 Anemia work up No results for input(s): VITAMINB12,  FOLATE, FERRITIN, TIBC, IRON, RETICCTPCT in the last 72 hours. Microbiology Recent Results (from the past 240 hour(s))  MRSA PCR Screening     Status: None   Collection Time: 01/04/16 10:23 AM  Result Value Ref Range Status   MRSA by PCR NEGATIVE NEGATIVE Final    Comment:        The GeneXpert MRSA Assay (FDA approved for NASAL specimens only), is one component of a comprehensive MRSA colonization surveillance program. It is not intended to diagnose MRSA infection nor to guide or monitor treatment for MRSA infections.      Discharge Instructions:   Discharge Instructions    Diet - low sodium heart healthy    Complete by:  As directed    Discharge instructions    Complete by:  As directed    For outpatient CTO intervention in [place]   Increase activity slowly    Complete by:  As  directed        Medication List    TAKE these medications   albuterol (2.5 MG/3ML) 0.083% nebulizer solution Commonly known as:  PROVENTIL Take 2.5 mg by nebulization every 6 (six) hours as needed for wheezing or shortness of breath.   ALPRAZolam 0.5 MG tablet Commonly known as:  XANAX Take 0.5 mg by mouth 3 (three) times daily as needed for anxiety.   amLODipine 2.5 MG tablet Commonly known as:  NORVASC Take 2.5 mg by mouth daily.   aspirin 81 MG chewable tablet Chew 81 mg by mouth daily.   cetirizine 10 MG tablet Commonly known as:  ZYRTEC Take 10 mg by mouth daily.   clopidogrel 75 MG tablet Commonly known as:  PLAVIX Take 75 mg by mouth daily.   cyclobenzaprine 10 MG tablet Commonly known as:  FLEXERIL Take 10 mg by mouth 3 (three) times daily as needed for muscle spasms.   gabapentin 800 MG tablet Commonly known as:  NEURONTIN Take 800-1,600 mg by mouth See admin instructions. Take 1 tablet three times a day and take 2 tablets at bedtime   HUMULIN R 500 UNIT/ML injection Generic drug:  insulin regular human CONCENTRATED Inject 40 Units into the skin 2 (two) times  daily with a meal.   HYDROcodone-acetaminophen 5-325 MG tablet Commonly known as:  NORCO/VICODIN Take 1 tablet by mouth every 4 (four) hours as needed for moderate pain.   isosorbide mononitrate 30 MG 24 hr tablet Commonly known as:  IMDUR Take 1 tablet (30 mg total) by mouth daily.   linaclotide 290 MCG Caps capsule Commonly known as:  LINZESS Take 290 mcg by mouth daily before breakfast.   metoprolol 100 MG tablet Commonly known as:  LOPRESSOR Take 100 mg by mouth 2 (two) times daily.   nicotine 21 mg/24hr patch Commonly known as:  NICODERM CQ - dosed in mg/24 hours Place 1 patch (21 mg total) onto the skin daily. Start taking on:  01/05/2016   nitroGLYCERIN 0.4 MG SL tablet Commonly known as:  NITROSTAT Place 0.4 mg under the tongue every 5 (five) minutes as needed for chest pain.   pantoprazole 40 MG tablet Commonly known as:  PROTONIX Take 40 mg by mouth 2 (two) times daily.   potassium chloride 10 MEQ tablet Commonly known as:  K-DUR,KLOR-CON Take 10 mEq by mouth daily.   Pramlintide Acetate 2700 MCG/2.7ML Sopn Inject 120 mcg into the skin 3 (three) times daily.   promethazine 25 MG tablet Commonly known as:  PHENERGAN Take 25 mg by mouth every 6 (six) hours as needed for nausea or vomiting.   ramipril 5 MG capsule Commonly known as:  ALTACE Take 5 mg by mouth daily.   rosuvastatin 10 MG tablet Commonly known as:  CRESTOR Take 10 mg by mouth daily.   spironolactone 50 MG tablet Commonly known as:  ALDACTONE Take 50 mg by mouth daily.   torsemide 20 MG tablet Commonly known as:  DEMADEX Take 20 mg by mouth daily.         Time coordinating discharge: 35 min  Signed:  Letty Salvi U Amela Handley   [facility] 01/04/2016, 3:03 PM

## 2016-01-04 NOTE — Progress Notes (Signed)
ANTICOAGULATION CONSULT NOTE - Initial Consult  Pharmacy Consult for Heparin Indication: chest pain/ACS  Allergies  Allergen Reactions  . Lipitor [Atorvastatin] Other (See Comments)    Raises liver enzymes Impairs renal function  . Penicillins Hives  . Trichlorethylene Other (See Comments)    Elevates liver enzymes  . Wellbutrin [Bupropion] Other (See Comments)    Altered mental status    Patient Measurements: Height: 5\' 10"  (177.8 cm) Weight: (!) 313 lb (142 kg) IBW/kg (Calculated) : 73 Heparin Dosing Weight: 106 kg  Vital Signs: Temp: 98.1 F (36.7 C) (10/08 2346) Temp Source: Oral (10/08 2346) BP: 117/68 (10/09 0730) Pulse Rate: 81 (10/09 0730)  Labs:  Recent Labs  01/03/16 2324 01/03/16 2350 01/04/16 0236 01/04/16 0855  HGB  --  14.7  --  14.3  HCT  --  44.4  --  43.1  PLT  --  74*  --  60*  LABPROT 13.2  --   --   --   INR 1.00  --   --   --   CREATININE  --  0.96  --   --   TROPONINI  --   --  <0.03  --     Estimated Creatinine Clearance: 136.8 mL/min (by C-G formula based on SCr of 0.96 mg/dL).   Medical History: Past Medical History:  Diagnosis Date  . Anxiety   . Bronchitis   . COPD (chronic obstructive pulmonary disease) (HCC)   . Coronary artery disease   . Diabetes mellitus without complication (HCC)   . GERD (gastroesophageal reflux disease)   . Myocardial infarct   . Neuropathy (HCC)   . Thrombocytopathia (HCC)   . Tobacco abuse     Assessment: 45 y.o. M presents with CP. Pt with significant cardiac history - s/p 5 MI and 16 stents. Noted with plt 74 (in June, plt were 77 so appears to be chronic). Not on anticoagulation PTA. Pharmacy consulted to dose heparin for r/o ACS.  Initial heparin level subtherapeutic (0.2) on 1500 units/h. Hg stable, plt trend down to 60 - will discuss with Cardiology. No bleed or IV line issues per RN.  Goal of Therapy:  Heparin level 0.3-0.7 units/ml Monitor platelets by anticoagulation protocol: Yes    Plan:  Increase heparin gtt to 1700 units/hr Will f/u heparin level in 6 hours Daily heparin level and CBC Watch platelet trend closely, monitor for s/sx bleeding  Babs Bertin, PharmD, BCPS Clinical Pharmacist 01/04/2016 9:53 AM

## 2016-01-04 NOTE — H&P (Addendum)
History and Physical    Alex Kim. Alex Kim:[id number] DOB: 16-Aug-1970 DOA: 01/04/2016  Referring MD/NP/PA:   PCP: Cheral Bay, MD   Patient coming from:  The patient is coming from home.  At baseline, pt is independent for most of ADL.   Chief Complaint: chest pain  HPI: Alex Kim. Alex Kim is a 45 y.o. 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  Patient states that his chest pain started at 8 PM on Saturday. It is located in the front chest, intermittent, pressure-like, 8 out of 10 in severity, radiating to the left arm and left shoulder. It is not aggravated or alleviated by any known factors. It is associated with shortness of breath. Patient states that he has chronic bilateral calf pain which has been going on for 3 years, no changes recently. He states that he drove back from [place] yesterday (3 hours). He has mild dry cough due to smoking. No fever or chills. He has nausea, but no vomiting, abdominal pain, diarrhea, symptoms of UTI. No unilateral weakness. Of note, patient went for elective heart cath on 11/25/17 at [facility], at which time he was found to have chronic total occlusion of RCA with collateral filling. PCI at that time was unsuccessful due to failure to cross wire. His cardiologist is Dr. Judithe Modest in [facility].  ED Course: pt was found to have negative troponin, CBC 7.2, platelets 74 which was 77 on 09/24/14, electrolytes renal function okay, temperature normal, no tachycardia, no tachypnea, O2 saturation 96% on room air, negative chest x-ray. Patient is placed on stepdown bed for observation. Cardiology was consulted by EDP. Review of Systems:   General: no fevers, chills, no changes in body weight, has fatigue HEENT: no blurry vision, hearing changes or sore  throat Respiratory: has dyspnea, coughing, no wheezing CV: no chest pain, no palpitations GI: has nausea, no vomiting, abdominal pain, diarrhea, constipation GU: no dysuria, burning on urination, increased urinary frequency, hematuria  Ext: has mild leg edema Neuro: no unilateral weakness, numbness, or tingling, no vision change or hearing loss Skin: no rash, no skin tear. MSK: No muscle spasm, no deformity, no limitation of range of movement in spin Heme: No easy bruising.  Travel history: No recent long distant travel.  Allergy:  Allergies  Allergen Reactions  . Lipitor [Atorvastatin] Other (See Comments)    Raises liver enzymes Impairs renal function  . Penicillins Hives  . Trichlorethylene Other (See Comments)    Elevates liver enzymes  . Wellbutrin [Bupropion] Other (See Comments)    Altered mental status    Past Medical History:  Diagnosis Date  . Anxiety   . Bronchitis   . COPD (chronic obstructive pulmonary disease) (HCC)   . Coronary artery disease   . Diabetes mellitus without complication (HCC)   . GERD (gastroesophageal reflux disease)   . Myocardial infarct   . Neuropathy (HCC)   . Thrombocytopathia (HCC)   . Tobacco abuse     Past Surgical History:  Procedure Laterality Date  . CARDIAC DEFIBRILLATOR PLACEMENT    . CARDIAC DEFIBRILLATOR PLACEMENT    . CORONARY ANGIOPLASTY WITH STENT PLACEMENT    . PACEMAKER INSERTION      Social History:  reports that he has been smoking.  He has never used smokeless tobacco. He reports that  he does not drink alcohol or use drugs.  Family History: No family history on file.   Prior to Admission medications   Medication Sig Start Date End Date Taking? Authorizing Provider  albuterol (PROVENTIL) (2.5 MG/3ML) 0.083% nebulizer solution Take 2.5 mg by nebulization every 6 (six) hours as needed for wheezing or shortness of breath.   Yes Historical Provider, MD  ALPRAZolam Prudy Feeler) 0.5 MG tablet Take 0.5 mg by mouth 3  (three) times daily as needed for anxiety.   Yes Historical Provider, MD  amLODipine (NORVASC) 2.5 MG tablet Take 2.5 mg by mouth daily.   Yes Historical Provider, MD  aspirin 81 MG chewable tablet Chew 81 mg by mouth daily.   Yes Historical Provider, MD  cetirizine (ZYRTEC) 10 MG tablet Take 10 mg by mouth daily.   Yes Historical Provider, MD  clopidogrel (PLAVIX) 75 MG tablet Take 75 mg by mouth daily.   Yes Historical Provider, MD  cyclobenzaprine (FLEXERIL) 10 MG tablet Take 10 mg by mouth 3 (three) times daily as needed for muscle spasms.   Yes Historical Provider, MD  gabapentin (NEURONTIN) 800 MG tablet Take 800-1,600 mg by mouth See admin instructions. Take 1 tablet three times a day and take 2 tablets at bedtime   Yes Historical Provider, MD  HYDROcodone-acetaminophen (NORCO/VICODIN) 5-325 MG tablet Take 1 tablet by mouth every 4 (four) hours as needed for moderate pain.   Yes Historical Provider, MD  insulin regular human CONCENTRATED (HUMULIN R) 500 UNIT/ML injection Inject 40 Units into the skin 2 (two) times daily with a meal.   Yes Historical Provider, MD  linaclotide (LINZESS) 290 MCG CAPS capsule Take 290 mcg by mouth daily before breakfast.   Yes Historical Provider, MD  metoprolol (LOPRESSOR) 100 MG tablet Take 100 mg by mouth 2 (two) times daily.   Yes Historical Provider, MD  nitroGLYCERIN (NITROSTAT) 0.4 MG SL tablet Place 0.4 mg under the tongue every 5 (five) minutes as needed for chest pain.   Yes Historical Provider, MD  pantoprazole (PROTONIX) 40 MG tablet Take 40 mg by mouth 2 (two) times daily.   Yes Historical Provider, MD  potassium chloride (K-DUR,KLOR-CON) 10 MEQ tablet Take 10 mEq by mouth daily.   Yes Historical Provider, MD  Pramlintide Acetate 2700 MCG/2.7ML SOPN Inject 120 mcg into the skin 3 (three) times daily.   Yes Historical Provider, MD  promethazine (PHENERGAN) 25 MG tablet Take 25 mg by mouth every 6 (six) hours as needed for nausea or vomiting.   Yes  Historical Provider, MD  ramipril (ALTACE) 5 MG capsule Take 5 mg by mouth daily.   Yes Historical Provider, MD  rosuvastatin (CRESTOR) 10 MG tablet Take 10 mg by mouth daily.   Yes Historical Provider, MD  spironolactone (ALDACTONE) 50 MG tablet Take 50 mg by mouth daily.   Yes Historical Provider, MD  torsemide (DEMADEX) 20 MG tablet Take 20 mg by mouth daily.   Yes Historical Provider, MD    Physical Exam: Vitals:   01/03/16 2346 01/04/16 0109  BP: 106/64 (!) 115/53  Pulse: 79 78  Resp: 18 18  Temp: 98.1 F (36.7 C)   TempSrc: Oral   SpO2: 96% 100%  Weight: (!) 142 kg (313 lb)   Height: 5\' 10"  (1.778 m)    General: Not in acute distress HEENT:       Eyes: PERRL, EOMI, no scleral icterus.       ENT: No discharge from the ears and nose, no pharynx injection,  no tonsillar enlargement.        Neck: Difficult to assess JVD due to morbid obesity, no bruit, no mass felt. Heme: No neck lymph node enlargement. Cardiac: S1/S2, RRR, No murmurs, No gallops or rubs. Respiratory: No rales, wheezing, rhonchi or rubs. GI: Soft, nondistended, nontender, no rebound pain, no organomegaly, BS present. GU: No hematuria Ext: Trace leg edema bilaterally. 2+DP/PT pulse bilaterally. Musculoskeletal: No joint deformities, No joint redness or warmth, no limitation of ROM in spin. Skin: No rashes.  Neuro: Alert, oriented X3, cranial nerves II-XII grossly intact, moves all extremities normally. Psych: Patient is not psychotic, no suicidal or hemocidal ideation.  Labs on Admission: I have personally reviewed following labs and imaging studies  CBC:  Recent Labs Lab 01/03/16 2350  WBC 7.2  HGB 14.7  HCT 44.4  MCV 101.6*  PLT 74*   Basic Metabolic Panel:  Recent Labs Lab 01/03/16 2350  NA 135  K 4.3  CL 102  CO2 25  GLUCOSE 357*  BUN 13  CREATININE 0.96  CALCIUM 9.2   GFR: Estimated Creatinine Clearance: 136.8 mL/min (by C-G formula based on SCr of 0.96 mg/dL). Liver Function  Tests: No results for input(s): AST, ALT, ALKPHOS, BILITOT, PROT, ALBUMIN in the last 168 hours. No results for input(s): LIPASE, AMYLASE in the last 168 hours. No results for input(s): AMMONIA in the last 168 hours. Coagulation Profile: No results for input(s): INR, PROTIME in the last 168 hours. Cardiac Enzymes: No results for input(s): CKTOTAL, CKMB, CKMBINDEX, TROPONINI in the last 168 hours. BNP (last 3 results) No results for input(s): PROBNP in the last 8760 hours. HbA1C: No results for input(s): HGBA1C in the last 72 hours. CBG: No results for input(s): GLUCAP in the last 168 hours. Lipid Profile: No results for input(s): CHOL, HDL, LDLCALC, TRIG, CHOLHDL, LDLDIRECT in the last 72 hours. Thyroid Function Tests: No results for input(s): TSH, T4TOTAL, FREET4, T3FREE, THYROIDAB in the last 72 hours. Anemia Panel: No results for input(s): VITAMINB12, FOLATE, FERRITIN, TIBC, IRON, RETICCTPCT in the last 72 hours. Urine analysis: No results found for: COLORURINE, APPEARANCEUR, LABSPEC, PHURINE, GLUCOSEU, HGBUR, BILIRUBINUR, KETONESUR, PROTEINUR, UROBILINOGEN, NITRITE, LEUKOCYTESUR Sepsis Labs: @LABRCNTIP (procalcitonin:4,lacticidven:4) )No results found for this or any previous visit (from the past 240 hour(s)).   Radiological Exams on Admission: Dg Chest 2 View  Result Date: 01/04/2016 CLINICAL DATA:  Left-sided chest pain. EXAM: CHEST  2 VIEW COMPARISON:  12/14/2015 FINDINGS: Left-sided pacemaker remains in place. Heart size and mediastinal contours are unchanged. No pulmonary edema, focal airspace disease, pleural effusion or pneumothorax. Calcified granuloma in the left lung. IMPRESSION: No active disease. Electronically Signed   By: Rubye Oaks M.D.   On: 01/04/2016 01:16     EKG: Independently reviewed.  Sinus rhythm, regular, QTc for 24, low voltage, old infarction pattern in inferior leads  Assessment/Plan Principal Problem:   Chest pain Active Problems:    Diabetes mellitus without complication (HCC)   Coronary artery disease   COPD (chronic obstructive pulmonary disease) (HCC)   HLD (hyperlipidemia)   GERD (gastroesophageal reflux disease)   Anxiety   Calf pain   Thrombocytopathia (HCC)   Chronic systolic CHF (congestive heart failure) (HCC)   Tobacco abuse   Chest pain and hx of CAD: Patient has very significant cardiac risk as listed in HPI, now presents with persistent chest pain since 8 PM on Saturday, highly suspect ACS. Chest x-ray is negative, no fever or leukocytosis, likely to have a pneumonia. Patient states that he has  chronic bilateral calf pain, which has not changed, I have low suspicion for PE. Cardiology was consulted by EDP (ED physician does not remember cardiologist name, but possibly Dr. Donnie Ahoilley, will see pt in AM).  -will admit to SDU (due to persistent chest pain) observation. - start IV nitro gtt - start IV Heparin (since patient has thrombocytopenia with platelet of 74, I discussed with pharmacist, who is okay to start heparin, but need to watch platelet closely) - cycle CE q6 x3 and repeat her EKG in the am  - Prn dilaudid - continue Aspirin, lipitor, metoprolol and plavix - Risk factor stratification: will check FLP, UDS and A1C  - 2d echo  DM-II: Last A1c 13.3 on 10/02/09 poorly controled. Patient is taking regular Humulin insulin 40 mg twice a day at home. Blood sugar 357 -will start Lantus 5 units daily -SSI -Check A1c  HLD: Last LDL was 121 on 07/26/08 -Continue home medications: Crestor -Check FLP  GERD: -Protonix  COPD: stable. -prn albuterol nebulizer  Anxiety:  -When necessary Xanax    Thrombocytopathia (HCC): Platelet 77 on 09/24/14--> 74 today. No bleeding tendency. Etiology is not clear. -Follow-up CBC  Chronic systolic congestive heart failure: 2-D echo on 12/15/15 showed EF 50%. Patient has trace leg edema. X-ray has no pulmonary edema. Clinically CHF is compensated on admission. -Continue  torsemide and spironolactone -Continue aspirin and metoprolol  Tobacco abuse: -Did counseling about importance of quitting smoking -Nicotine patch  Calf pain: -LE venous doppler to r/o DVT  DVT ppx: on IV Heparin Code Status: Full code Family Communication: Yes, patient's  wife at bed side Disposition Plan:  Anticipate discharge back to previous home environment Consults called:  Cardiology was consulted by EDP (ED physician does not remember cardiologist's name, but possibly Dr. Donnie Ahoilley, will see pt in AM). Admission status: Obs / tele    Date of Service 01/04/2016    Lorretta HarpIU, Kenyon Eichelberger [facility] Pager [phone]  If 7PM-7AM, please contact night-coverage [email or web address] Password [email or web address] 01/04/2016, 2:54 AM

## 2016-01-04 NOTE — Consult Note (Signed)
Cardiology Consult    Patient ID: Alex Kim MRN: [id number], DOB/AGE: February 02, 1971   Admit date: 01/04/2016 Date of Consult: 01/04/2016  Primary Physician: Cheral BayHAWKS,ALDENE N, MD Primary Cardiologist: Dr. Judithe ModestFolk ([place]) Requesting Provider: Dr. Benjamine MolaVann Reason for Consultation: Chest pain  Patient Profile    45 yo male with PMH of CAD s/p multiple stents, ICM s/p ICD, IDDM, COPD, anxiety and tobacco abuse who presented to the [facility] with reports of on-going chest central chest pain.   Past Medical History   Past Medical History:  Diagnosis Date  . Anxiety   . Bronchitis   . COPD (chronic obstructive pulmonary disease) (HCC)   . Coronary artery disease   . Diabetes mellitus without complication (HCC)   . GERD (gastroesophageal reflux disease)   . Myocardial infarct   . Neuropathy (HCC)   . Thrombocytopathia (HCC)   . Tobacco abuse     Past Surgical History:  Procedure Laterality Date  . CARDIAC DEFIBRILLATOR PLACEMENT    . CARDIAC DEFIBRILLATOR PLACEMENT    . CORONARY ANGIOPLASTY WITH STENT PLACEMENT    . PACEMAKER INSERTION       Allergies  Allergies  Allergen Reactions  . Lipitor [Atorvastatin] Other (See Comments)    Raises liver enzymes Impairs renal function  . Penicillins Hives  . Trichlorethylene Other (See Comments)    Elevates liver enzymes  . Wellbutrin [Bupropion] Other (See Comments)    Altered mental status    History of Present Illness    Alex Kim is a 45 yo male with extensive CAD s/p multiple stents to LAD, RCA, OM and Diag. Reports his primary cardiologist is located in [place], Dr. Judithe ModestFolk. First cath was back in 2005 and has had many repeat caths since that time. He was most recently admitted and underwent LHC on 11/26/15 showing a total occlusion of the RCA with collaterals. Unsuccessful PCI of the mid RCA due to failure to cross with guidewire. EF was reported by cath at 35%. At that time the plan was to proceed with added medical therapy with the  addition of Imdur and Ranexa. He then presented to [facility] on 9/2 with reports of anterior chest pain. At that time the decision was made to continue with medical therapy and obtain records from [place]. Appears that he left the following day AMA. He was then seen at his primary cardiologist office on 12/10/15. At that visit he reported on-going anginal symptoms since having his heart cath. The decision was made to attempt to bring back again for another attempt with Dr. Beverely Paceheek and Dr. Reuel Boomaniel. Amlodipine was added to his home medications at that visit. Imdur and Ranexa were discontinued at that visit as well.   He then presented to the [facility] with ongoing reports of chest pain on 9/20. During that admission he underwent a nuclear stress test showing fixed inferior and apical defect with reversibility of the lateral wall and determined high risk but no new changes. He was advised to follow up with his cardiologist as outpatient.  He presented to the [facility] on 10/8 reporting persistent centralized chest pain that started Saturday night. States it radiated into both arms and had associated dyspnea. Labs thus far showed stable electrolytes, Trop 0.00>>0.03>>0.03 with non diagnostic trend. He was started on IV nitro and heparin given his known hx and reports of on-going chest pain. He continues to smoke, and states his blood sugars run in the 250s generally at home. States this current pain  has been present since december of last year (usually has 3/10 pain), but seemed to become worse on Saturday.   Inpatient Medications    . amLODipine  2.5 mg Oral Daily  . aspirin  81 mg Oral Daily  . clopidogrel  75 mg Oral Daily  . gabapentin  1,600 mg Oral QHS  . gabapentin  800 mg Oral TID AC  . insulin aspart  0-9 Units Subcutaneous TID WC  . insulin glargine  5 Units Subcutaneous Daily  . linaclotide  290 mcg Oral QAC breakfast  . loratadine  10 mg Oral Daily  . metoprolol  100 mg Oral BID  . nicotine  21  mg Transdermal Daily  . pantoprazole  40 mg Oral BID  . ramipril  5 mg Oral Daily  . rosuvastatin  10 mg Oral q1800  . spironolactone  50 mg Oral Daily  . torsemide  20 mg Oral Daily    Family History    No family history on file.  Social History    Social History   Social History  . Marital status: Married    Spouse name: N/A  . Number of children: N/A  . Years of education: N/A   Occupational History  . Not on file.   Social History Main Topics  . Smoking status: Current Every Day Smoker  . Smokeless tobacco: Never Used  . Alcohol use No  . Drug use: No  . Sexual activity: Not on file   Other Topics Concern  . Not on file   Social History Narrative  . No narrative on file     Review of Systems    General:  No chills, fever, night sweats or weight changes.  Cardiovascular: See HPI Dermatological: No rash, lesions/masses Respiratory: No cough, dyspnea Urologic: No hematuria, dysuria Abdominal:   No nausea, vomiting, diarrhea, bright red blood per rectum, melena, or hematemesis Neurologic:  No visual changes, wkns, changes in mental status. All other systems reviewed and are otherwise negative except as noted above.  Physical Exam    Blood pressure 125/81, pulse 84, temperature 98.1 F (36.7 C), temperature source Oral, resp. rate 16, height 5\' 10"  (1.778 m), weight (!) 313 lb (142 kg), SpO2 96 %.  General: Pleasant severely obese caucasian male, NAD Psych: Normal affect. Neuro: Alert and oriented X 3. Moves all extremities spontaneously. HEENT: Normal  Neck: Supple without bruits, difficult to assess JVD given girth. Lungs:  Resp regular and unlabored, CTA. Heart: RRR no s3, s4, or murmurs. Abdomen: Soft, non-tender, non-distended, BS + x 4.  Extremities: No clubbing, cyanosis 1+ lower extermity edema. DP/PT/Radials 2+ and equal bilaterally.  Labs    Troponin [facility] Test)  Recent Labs  01/04/16 0007  TROPIPOC 0.00    Recent Labs   01/04/16 0236 01/04/16 0855  TROPONINI <0.03 0.03*   Lab Results  Component Value Date   WBC 6.4 01/04/2016   HGB 14.3 01/04/2016   HCT 43.1 01/04/2016   MCV 101.7 (H) 01/04/2016   PLT 60 (L) 01/04/2016    Recent Labs Lab 01/03/16 2350  NA 135  K 4.3  CL 102  CO2 25  BUN 13  CREATININE 0.96  CALCIUM 9.2  GLUCOSE 357*   Lab Results  Component Value Date   CHOL 151 01/04/2016   HDL 28 (L) 01/04/2016   LDLCALC 63 01/04/2016   TRIG 299 (H) 01/04/2016   No results found for: [facility]   Radiology Studies    Dg Chest  2 View  Result Date: 01/04/2016 CLINICAL DATA:  Left-sided chest pain. EXAM: CHEST  2 VIEW COMPARISON:  12/14/2015 FINDINGS: Left-sided pacemaker remains in place. Heart size and mediastinal contours are unchanged. No pulmonary edema, focal airspace disease, pleural effusion or pneumothorax. Calcified granuloma in the left lung. IMPRESSION: No active disease. Electronically Signed   By: Rubye Oaks M.D.   On: 01/04/2016 01:16    ECG & Cardiac Imaging    EKG:  Echo: 12/15/15  Mitral Valve Structurally normal mitral valve with good mobility and no significant regurgitation. Aortic Valve The aortic valve leaflets were not well visualized. No aortic stenosis. No aortic regurgitation. Tricuspid Valve Tricuspid valve is structurally normal. No significant tricuspid regurgitation . Pulmonic Valve The pulmonic valve was not well visualized No Doppler evidence of pulmonic stenosis or insufficiency. Left Atrium Mild Left atrial enlargement. Left Ventricle Mildly dilated left ventricle. Mild Infero-posterior segmental LV Dysfunction Ejection fraction is visually estimated at 50% Right Atrium Normal right atrium. Right Ventricle Normal right ventricle structure and function. Pacer and/or defibrillator wires visualized in right ventricle. Pericardial Effusion No evidence of pericardial effusion. Miscellaneous The aorta is within normal limits. The  IVC is normal  LHC: 11/26/15  Chronic Total Occlusion of the RCA with collateral filling. Otherwise non-obstructive coronary artery disease. Severe inferior segmental LV systolic dysfunction. LV ejection fraction is 35 % Interventional Summary Unsuccessful PCI of the mid Right Coronary Artery. Unsuccessful PCI due to failure to cross with guidewire. Interventional Recommendations Medical therapy for CAD Risk factor reduction, Angina Will add Imdur, consider ranexa Consider CTO PCI of RCA If fails medical therapy .  Assessment & Plan    45 yo male with PMH of CAD s/p multiple stents, ICM s/p ICD, IDDM, COPD, anxiety and tobacco abuse who presented to the [facility] ED with reports of on-going chest central chest pain.   1. Unstable Angina: States he normally lives with chest pain rates 3/10, but pain became worse Saturday night with associated pain to bilateral arms and dyspnea. Had a known CTO of RCA with plans via his primary cardiologist to re-attempt intervention at some point, but not yet scheduled. Was placed on Imdur and Ranexa, but unable to tolerate. Recently seen at [facility] back on 9/20 with nuc done showing known defects with instructions to follow up with primary cards. I did ask the patient why he presented here for admission, and he stated " he did research to find the best heart hospital".  -- EKG shows NSR with TWI in inferior leads, previous tracings show PVCs with bigeminy. Telemetry shows freq PVCs with noted bigeminy. Trop cycled with no diagnostic trend thus far. Remains on IV nitro and heparin at this time. Does report continued chest pain at the time of exam.  -- Will discuss patient case with MD given the complexity of his cardiac hx and known lesion. He is currently hemodynamically stable, sitting in bed comfortably.   2. IDDM: See an endocrinologist. Blood sugars uncontrolled at home per patient report  3. Tobacco abuse: Continue to smoke. Cessation strongly  encouraged.  4. Morbid Obesity  5. HLD: Last lipid panel with poor control. Appears unable to tolerate high dose statin in the past due to elevated LFTs after being on medication.  Janice Coffin, NP-C Pager [phone] 01/04/2016, 11:05 AM

## 2016-01-04 NOTE — Progress Notes (Signed)
ANTICOAGULATION CONSULT NOTE - Initial Consult  Pharmacy Consult for Heparin Indication: chest pain/ACS  Allergies  Allergen Reactions  . Lipitor [Atorvastatin] Other (See Comments)    Raises liver enzymes Impairs renal function  . Penicillins Hives  . Trichlorethylene Other (See Comments)    Elevates liver enzymes  . Wellbutrin [Bupropion] Other (See Comments)    Altered mental status    Patient Measurements: Height: 5\' 10"  (177.8 cm) Weight: (!) 313 lb (142 kg) IBW/kg (Calculated) : 73 Heparin Dosing Weight: 106 kg  Vital Signs: Temp: 98.1 F (36.7 C) (10/08 2346) Temp Source: Oral (10/08 2346) BP: 115/53 (10/09 0109) Pulse Rate: 78 (10/09 0109)  Labs:  Recent Labs  01/03/16 2350  HGB 14.7  HCT 44.4  PLT 74*  CREATININE 0.96    Estimated Creatinine Clearance: 136.8 mL/min (by C-G formula based on SCr of 0.96 mg/dL).   Medical History: Past Medical History:  Diagnosis Date  . Anxiety   . Bronchitis   . COPD (chronic obstructive pulmonary disease) (HCC)   . Coronary artery disease   . Diabetes mellitus without complication (HCC)   . GERD (gastroesophageal reflux disease)   . Myocardial infarct   . Neuropathy (HCC)   . Thrombocytopathia (HCC)   . Tobacco abuse     Medications:  See electronic med rec  Assessment: 45 y.o. M presents with CP. Pt with significant cardiac history - s/p 5 MI and 16 stents. Noted with plt 74 (in June, plt were 77 so appears to be chronic). To begn heparin for r/o ACS. H/H stable.  Goal of Therapy:  Heparin level 0.3-0.7 units/ml Monitor platelets by anticoagulation protocol: Yes   Plan:  Heparin IV bolus 4000 units Heparin gtt at 1500 units/hr Will f/u heparin level in 6 hours Daily heparin level and CBC Watch platelets closely  Christoper Fabian, PharmD, BCPS Clinical pharmacist, pager [phone] 01/04/2016,2:24 AM

## 2016-01-04 NOTE — ED Notes (Signed)
Niu MD at bedside. 

## 2016-01-05 LAB — HEMOGLOBIN A1C
HEMOGLOBIN A1C: 11 % — AB (ref 4.8–5.6)
MEAN PLASMA GLUCOSE: 269 mg/dL

## 2016-02-07 IMAGING — CT CT HEAD W/O CM
1 series · 16 of 30 positions shown, 20 images · non-contrast
Comparison: 05/29/2014

CLINICAL DATA: Headache.  Left hand and face tingling.

EXAM:
CT HEAD WITHOUT CONTRAST
TECHNIQUE: Contiguous axial images were obtained from the base of the skull
through the vertex without intravenous contrast.

[Series 2: head 4.8 h37s · axial · 0.45mm/px · z∈[+1157,+1297]mm · 16 of 32 slices shown, 20 images]
[im 2/32  brain]
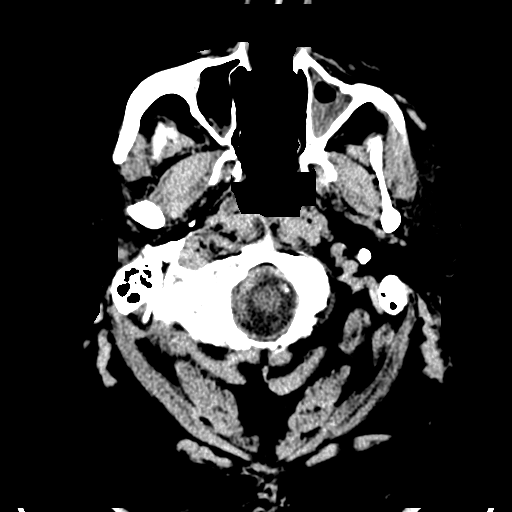
[im 2/32  bone]
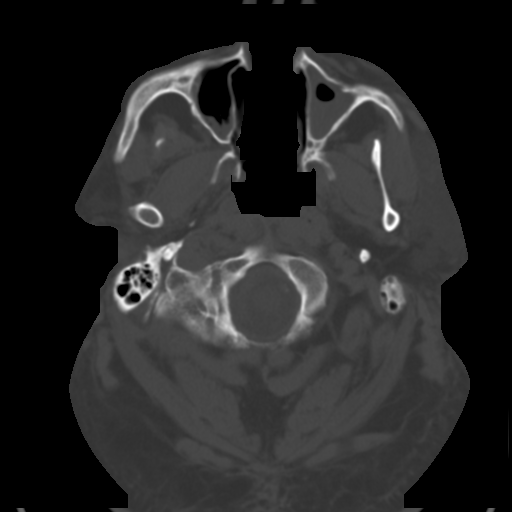
[im 4/32  brain]
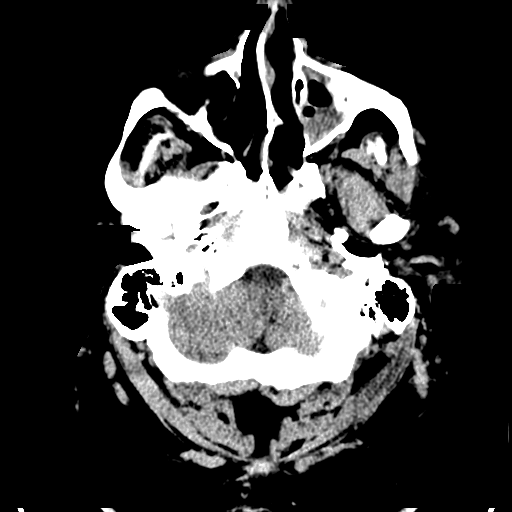
[im 6/32  brain]
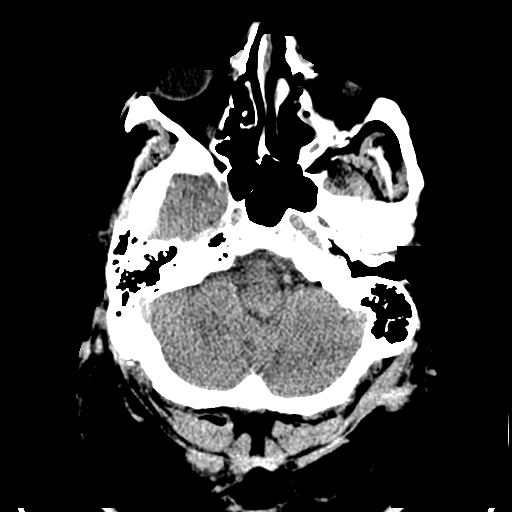
[im 8/32  brain]
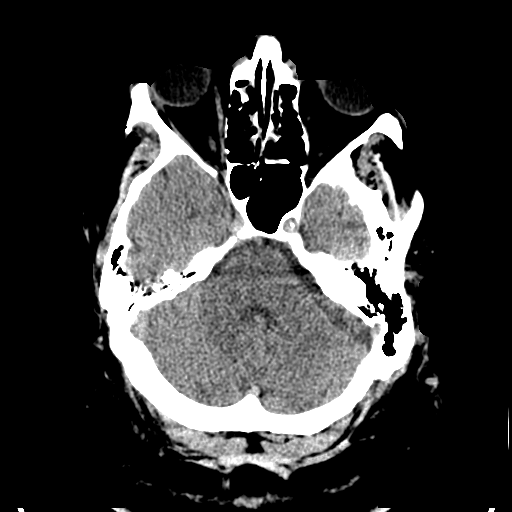
[im 9/32  brain]
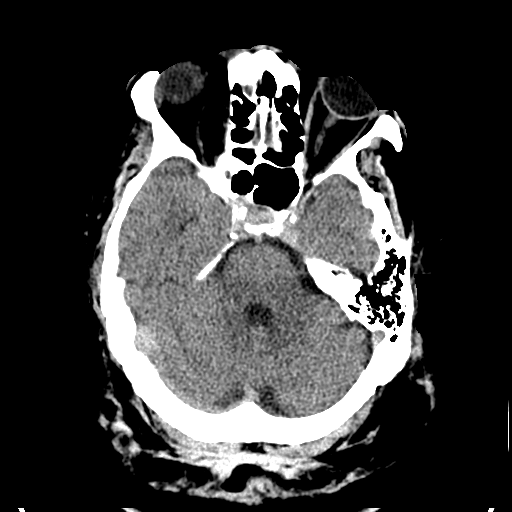
[im 9/32  bone]
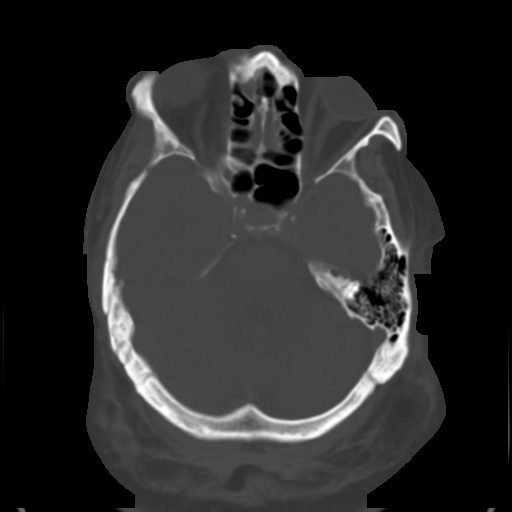
[im 11/32  brain]
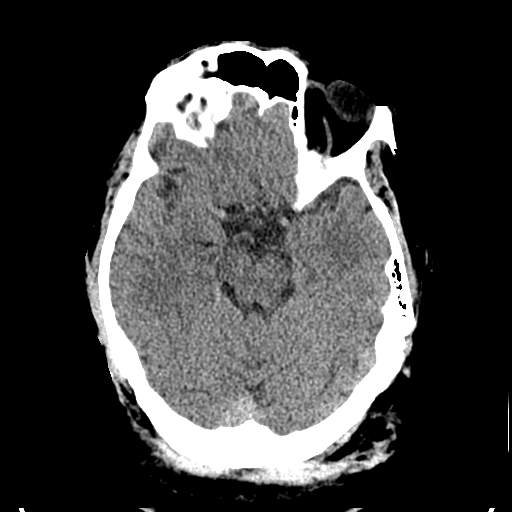
[im 13/32  brain]
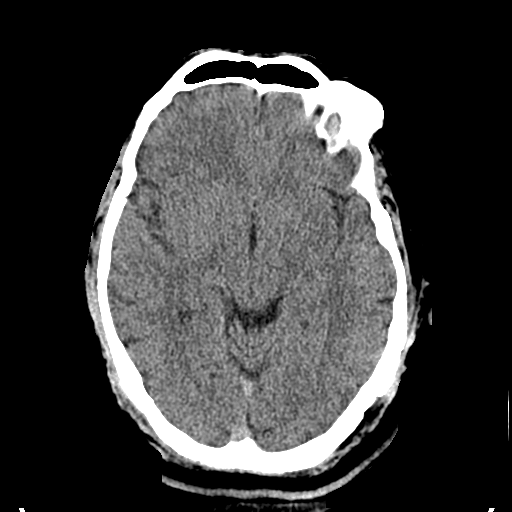
[im 15/32  brain]
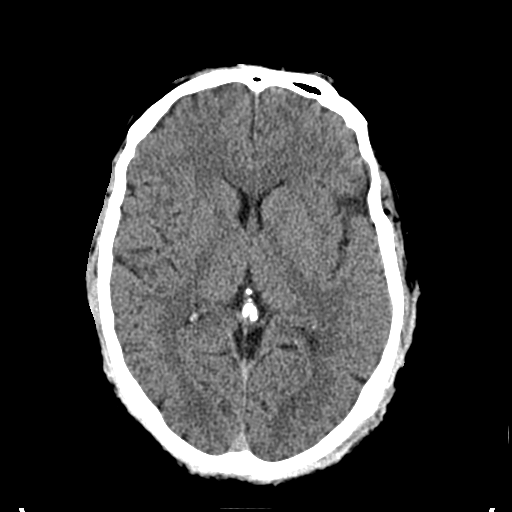
[im 17/32  brain]
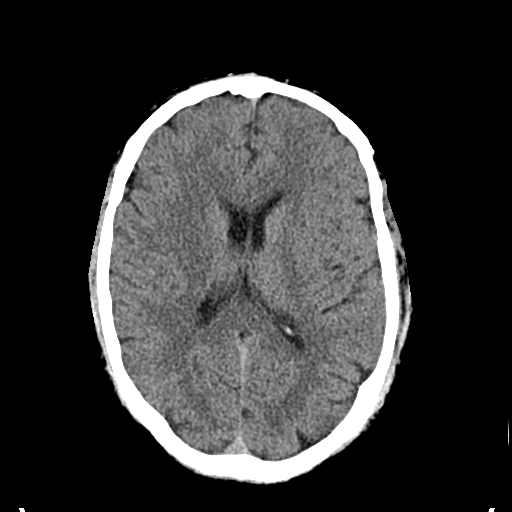
[im 17/32  bone]
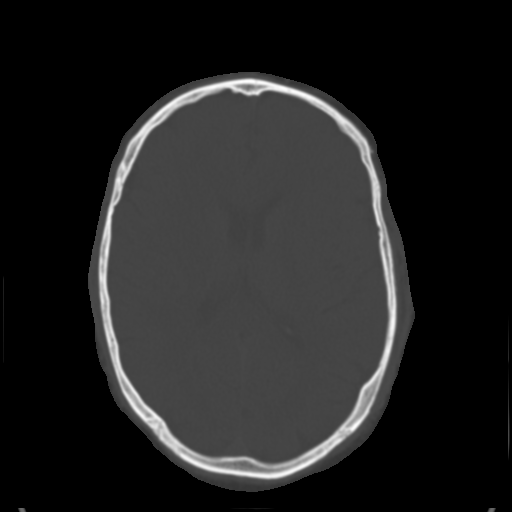
[im 19/32  brain]
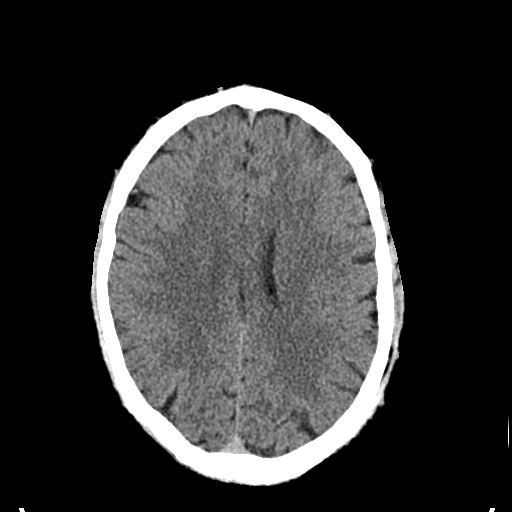
[im 21/32  brain]
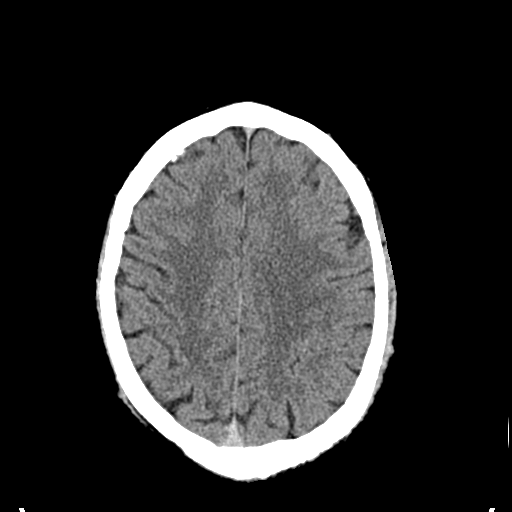
[im 23/32  brain]
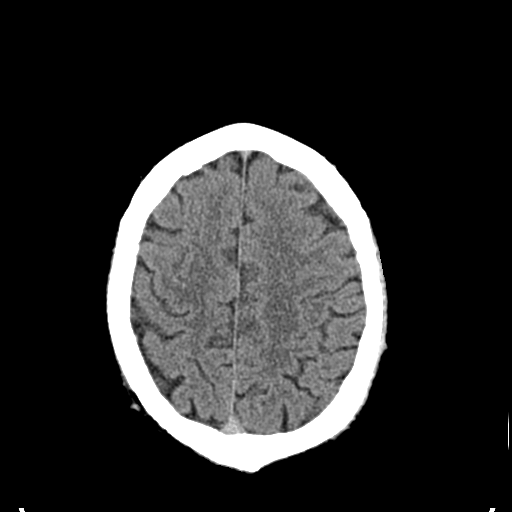
[im 24/32  brain]
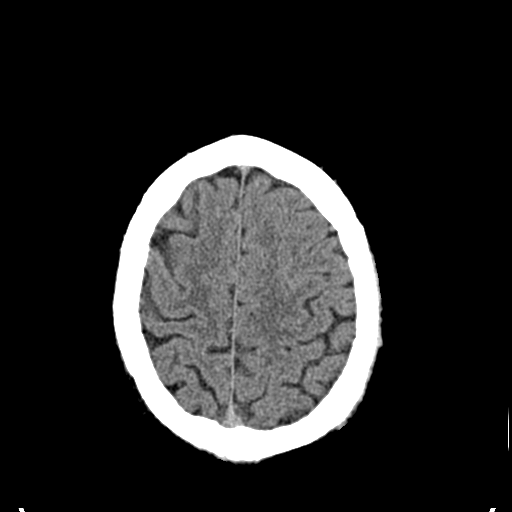
[im 24/32  bone]
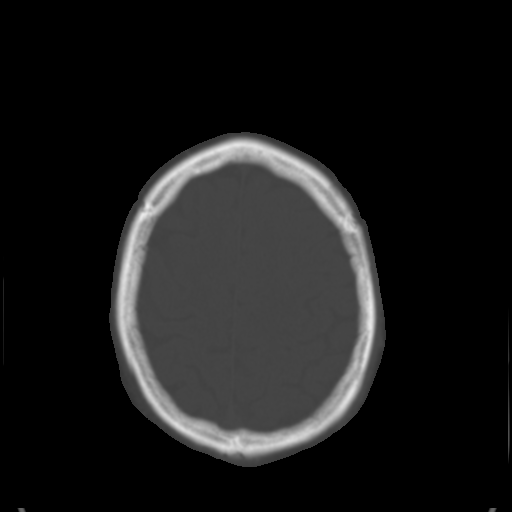
[im 26/32  brain]
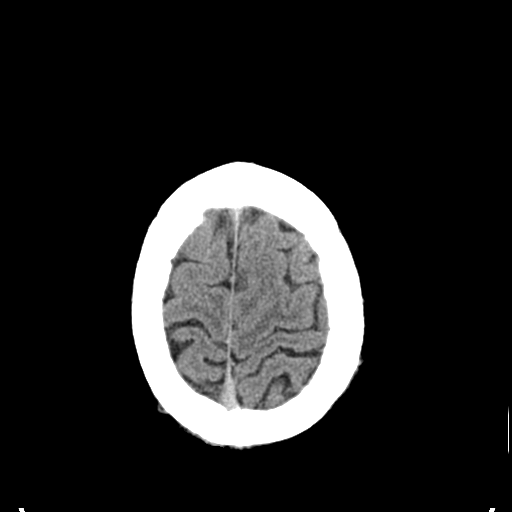
[im 28/32  brain]
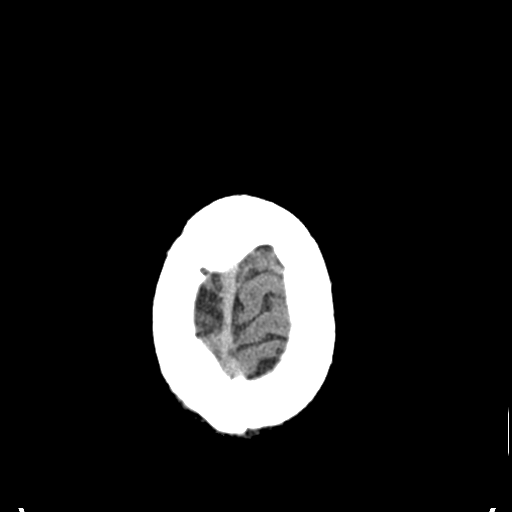
[im 30/32  brain]
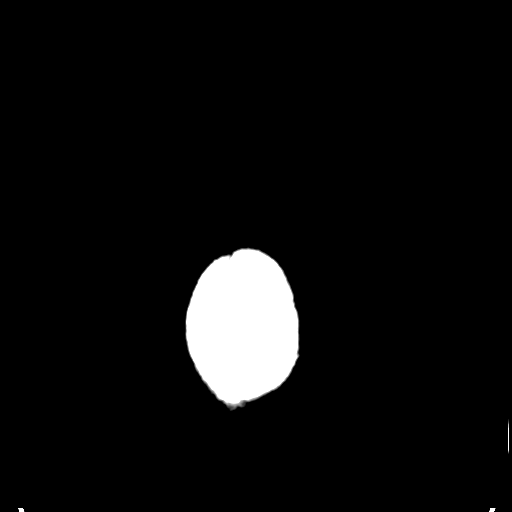

[16 of 30 positions shown; findings below may reference images not displayed]

FINDINGS: Gray-white differentiation is maintained. No CT evidence of acute
large territory infarct. No intraparenchymal or extra-axial mass or
hemorrhage. Normal size and configuration of the ventricles and
basilar cisterns. No midline shift. Intracranial atherosclerosis.
There is near complete opacification of the left maxillary sinus.
There is scattered opacification of the right maxillary sinus. The
remaining paranasal sinuses and mastoid air cells are normally
aerated. No air-fluid levels. Regional soft tissues appear normal.
No displaced calvarial fracture.
IMPRESSION: 1. Negative noncontrast head CT.
2. Sinus disease as above.  No air-fluid levels.

## 2017-06-22 IMAGING — CR DG CHEST 2V
2 series · 2 of 2 positions shown · non-contrast
Comparison: 12/14/2015

CLINICAL DATA: Left-sided chest pain.

EXAM:
CHEST  2 VIEW

[chest pa]
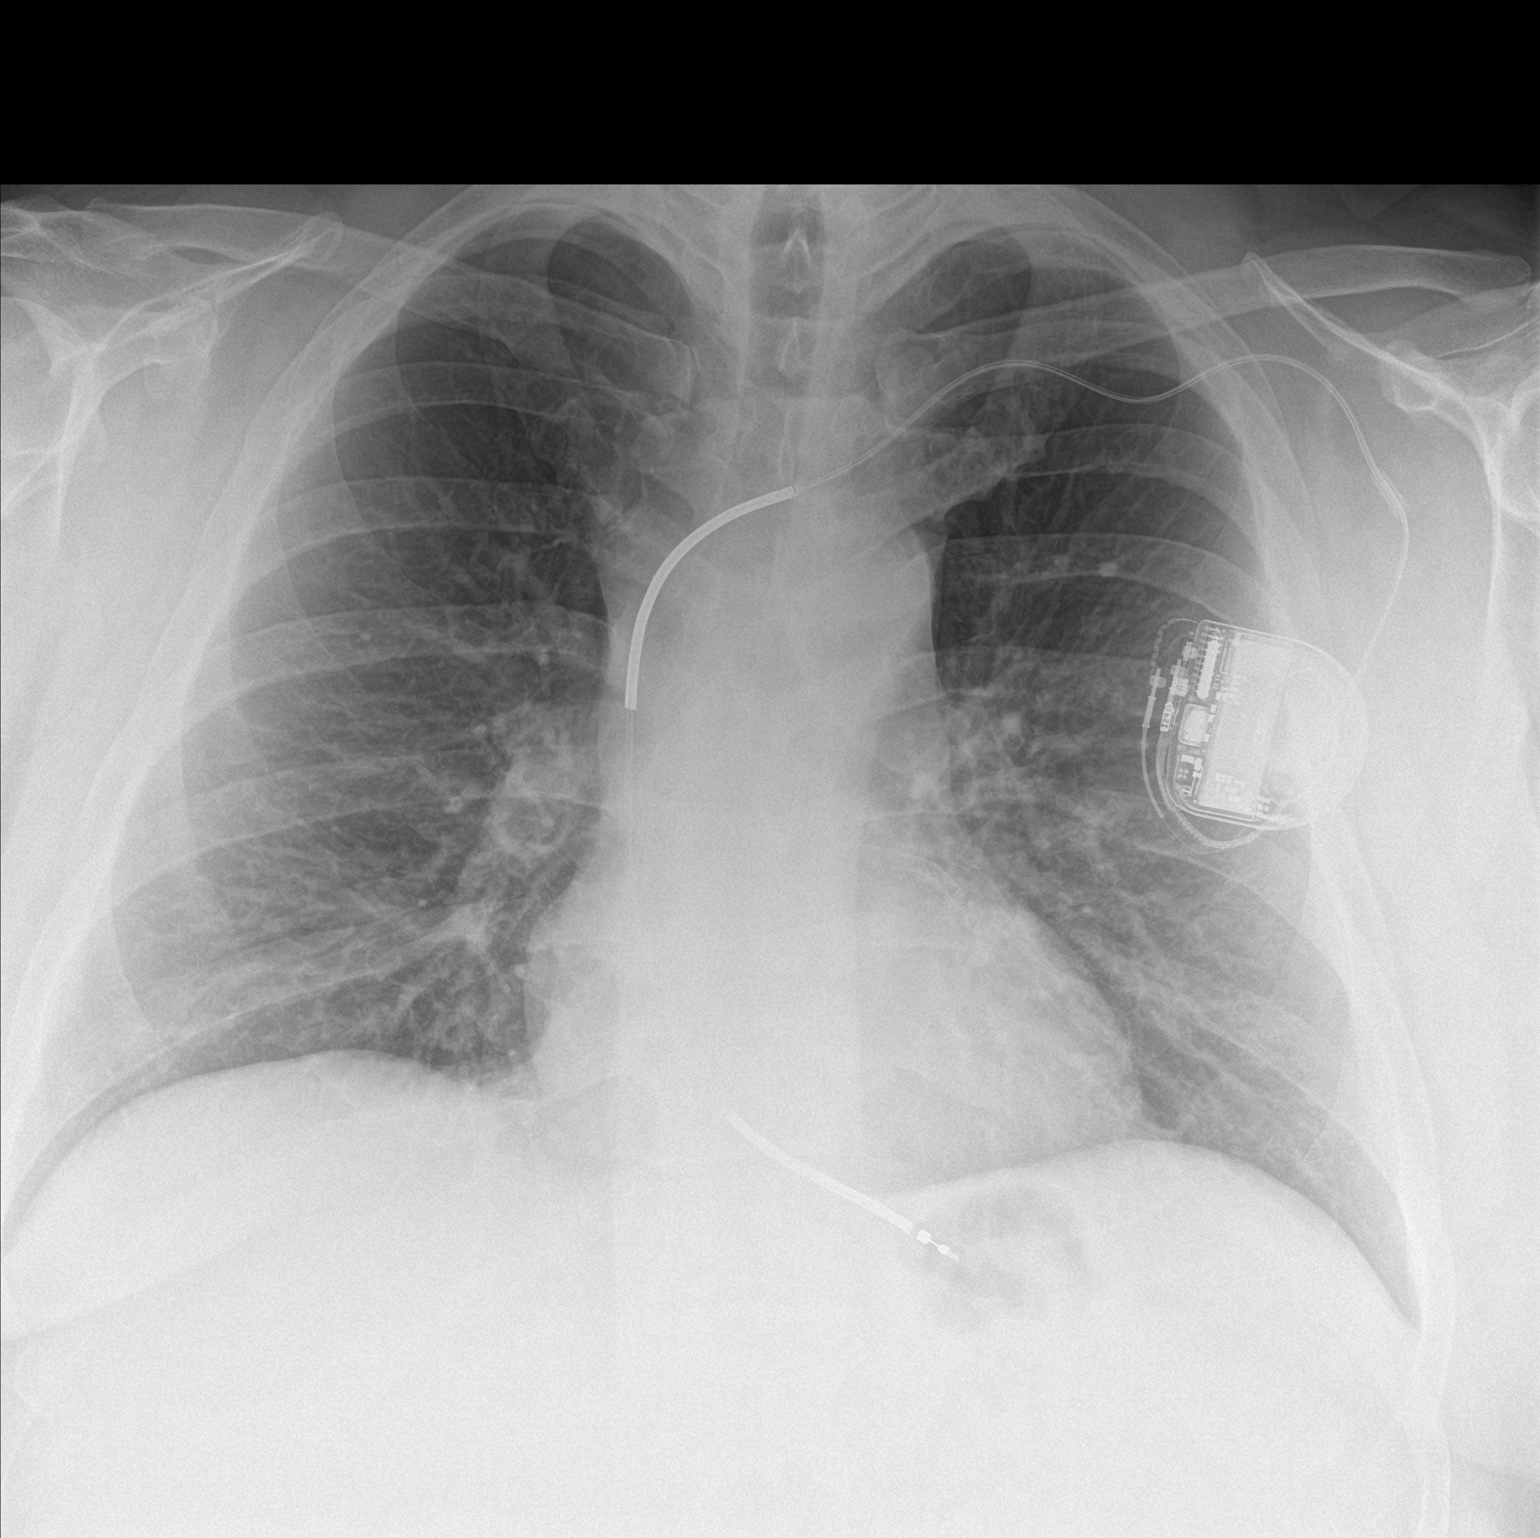

[chest lat]
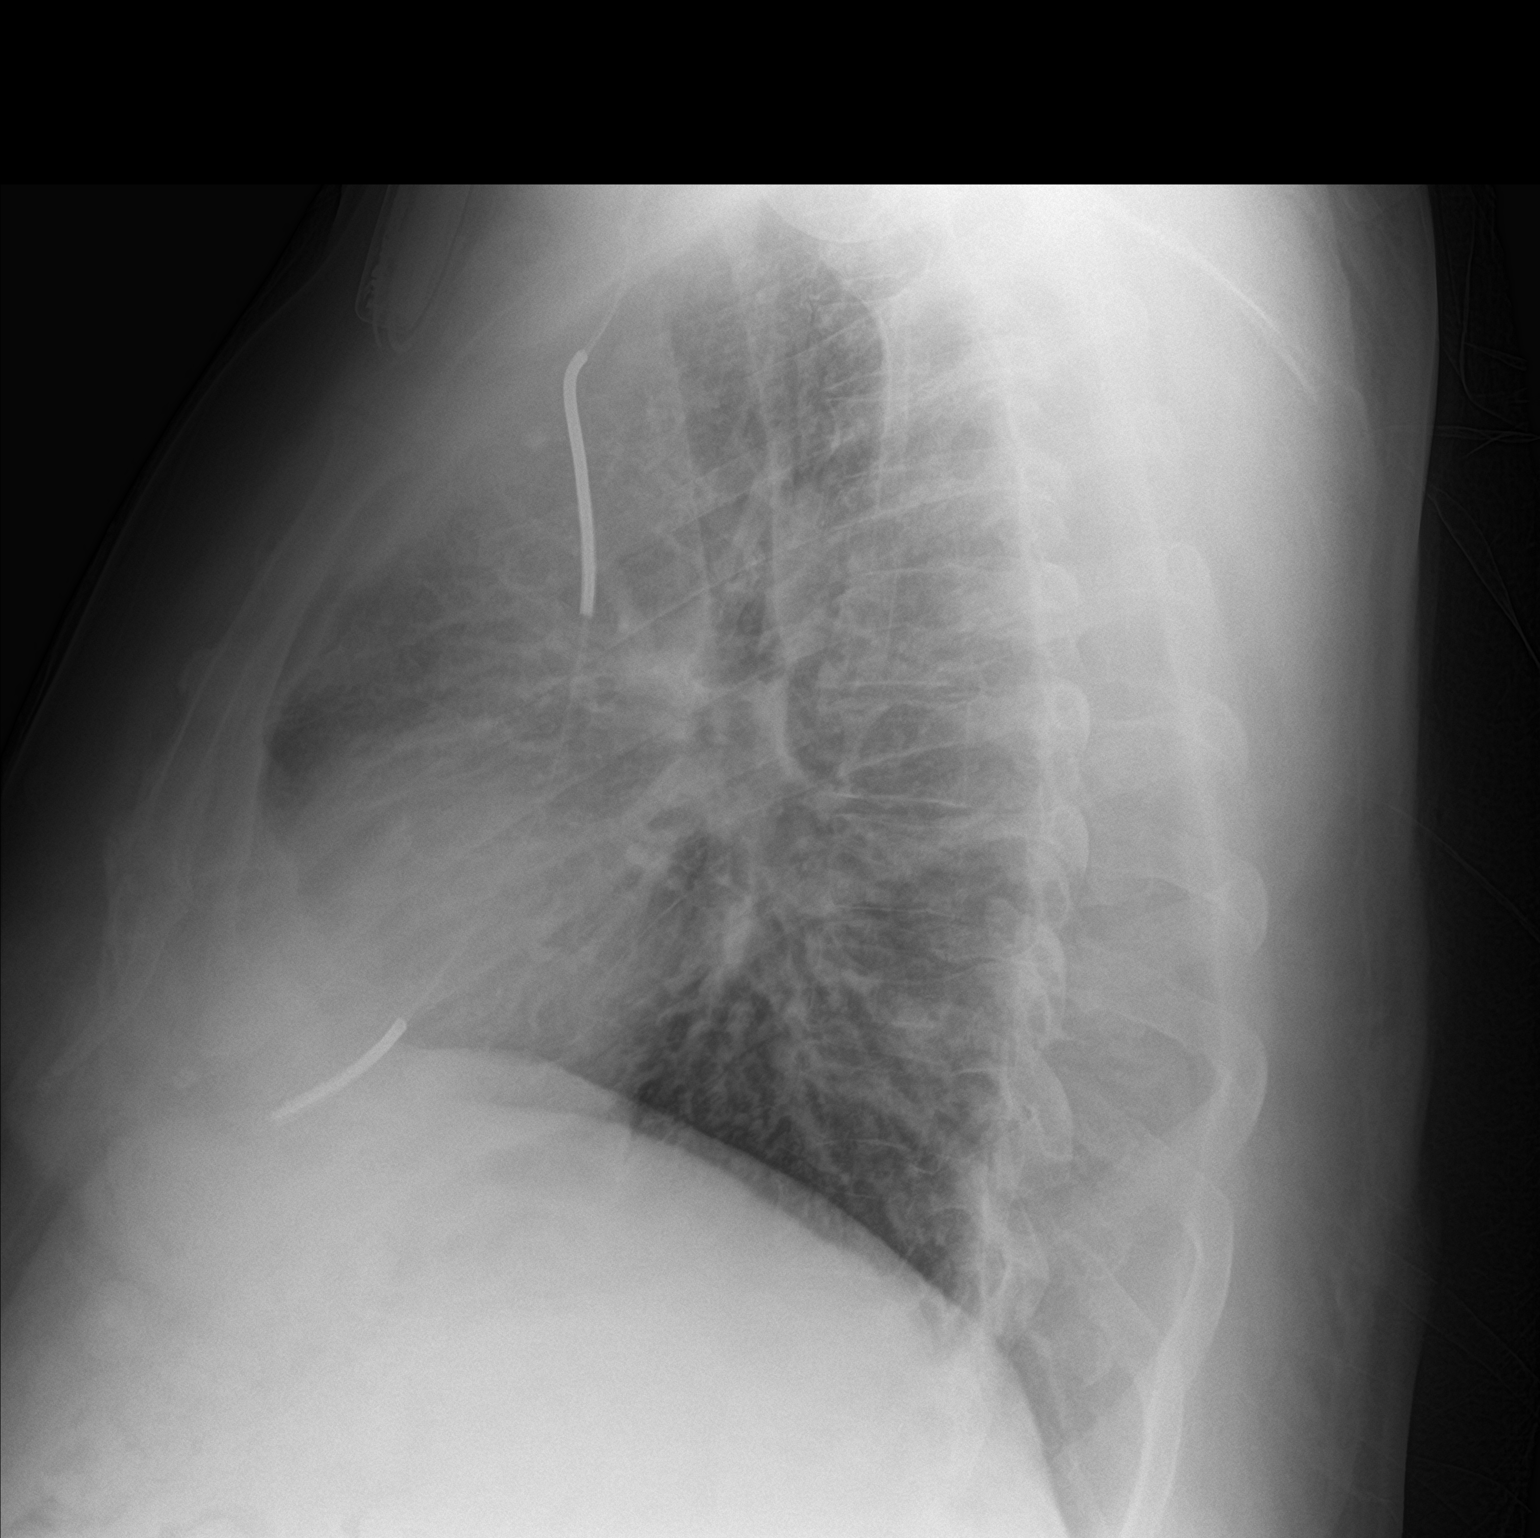

[2 of 2 positions shown; findings below may reference images not displayed]

FINDINGS: Left-sided pacemaker remains in place. Heart size and mediastinal
contours are unchanged. No pulmonary edema, focal airspace disease,
pleural effusion or pneumothorax. Calcified granuloma in the left
lung.
IMPRESSION: No active disease.

## 2018-07-27 DEATH — deceased
